# Patient Record
Sex: Male | Born: 1990 | Hispanic: No | Marital: Single | Smoking: Current every day smoker
Health system: Southern US, Community
[De-identification: ages and names within clinical notes are randomized; demographics above are authoritative.]

## PROBLEM LIST (undated history)

## (undated) ENCOUNTER — Emergency Department (HOSPITAL_COMMUNITY): Discharge status: Absent without leave | Payer: PRIVATE HEALTH INSURANCE

## (undated) DIAGNOSIS — Z720 Tobacco use: Secondary | ICD-10-CM

## (undated) HISTORY — DX: Tobacco use: Z72.0

---

## 2014-04-27 ENCOUNTER — Emergency Department (HOSPITAL_COMMUNITY)
Admission: EM | Admit: 2014-04-27 | Discharge: 2014-04-27 | Disposition: A | Discharge status: Home / Self Care | Payer: Managed Care, Other (non HMO) | Attending: Emergency Medicine | Admitting: Emergency Medicine

## 2014-04-27 ENCOUNTER — Encounter (HOSPITAL_COMMUNITY): Payer: Self-pay | Admitting: Emergency Medicine

## 2014-04-27 DIAGNOSIS — F17200 Nicotine dependence, unspecified, uncomplicated: Secondary | ICD-10-CM | POA: Insufficient documentation

## 2014-04-27 DIAGNOSIS — H9209 Otalgia, unspecified ear: Secondary | ICD-10-CM | POA: Insufficient documentation

## 2014-04-27 DIAGNOSIS — J3489 Other specified disorders of nose and nasal sinuses: Secondary | ICD-10-CM | POA: Insufficient documentation

## 2014-04-27 DIAGNOSIS — H9202 Otalgia, left ear: Secondary | ICD-10-CM

## 2014-04-27 DIAGNOSIS — R0981 Nasal congestion: Secondary | ICD-10-CM

## 2014-04-27 MED ORDER — OXYMETAZOLINE HCL 0.05 % NA SOLN: 1.0000 | Freq: Two times a day (BID) | NASAL | Status: AC

## 2014-04-27 NOTE — ED Notes (Addendum)
Pt c/o intermittent L ear pain x 2 days.  Currently, denies pain.      Pt speaks broken [place].

## 2014-04-27 NOTE — ED Provider Notes (Signed)
  Face-to-face evaluation   History: He complains of left, you pain after flying out of the country several days ago. He has had mild sore throat and nasal congestion recently. He denies fever, chills, nausea, vomiting, with this.  Physical exam: Alert , calm, cooperative. TMs appear normal architecture, bilaterally. Left TM has a 3-4 mm, flat lesion that appears to be old scar. There is no erythema of the TM. Oropharynx is slightly injected. There is no tonsillar hypertrophy or exudate. Neck is supple and nontender.  Medical screening examination/treatment/procedure(s) were conducted as a shared visit with non-physician practitioner(s) and myself.  I personally evaluated the patient during the encounter  Richarda Blade, MD 04/27/14 [phone]

## 2014-04-27 NOTE — Discharge Instructions (Signed)
Use afrin nasal spray and spray 1 spray into both nostril twice daily for the next 5 days to help with nasal congestion and ear pain.  If you notice no improvement, please follow up with Dr. Redmond Baseman by calling office to set up an appointment.    Otalgia The most common reason for this in children is an infection of the middle ear. Pain from the middle ear is usually caused by a build-up of fluid and pressure behind the eardrum. Pain from an earache can be sharp, dull, or burning. The pain may be temporary or constant. The middle ear is connected to the nasal passages by a short narrow tube called the Eustachian tube. The Eustachian tube allows fluid to drain out of the middle ear, and helps keep the pressure in your ear equalized. CAUSES  A cold or allergy can block the Eustachian tube with inflammation and the build-up of secretions. This is especially likely in small children, because their Eustachian tube is shorter and more horizontal. When the Eustachian tube closes, the normal flow of fluid from the middle ear is stopped. Fluid can accumulate and cause stuffiness, pain, hearing loss, and an ear infection if germs start growing in this area. SYMPTOMS  The symptoms of an ear infection may include fever, ear pain, fussiness, increased crying, and irritability. Many children will have temporary and minor hearing loss during and right after an ear infection. Permanent hearing loss is rare, but the risk increases the more infections a child has. Other causes of ear pain include retained water in the outer ear canal from swimming and bathing. Ear pain in adults is less likely to be from an ear infection. Ear pain may be referred from other locations. Referred pain may be from the joint between your jaw and the skull. It may also come from a tooth problem or problems in the neck. Other causes of ear pain include:  A foreign body in the ear.  Outer ear infection.  Sinus infections.  Impacted ear  wax.  Ear injury.  Arthritis of the jaw or TMJ problems.  Middle ear infection.  Tooth infections.  Sore throat with pain to the ears. DIAGNOSIS  Your caregiver can usually make the diagnosis by examining you. Sometimes other special studies, including x-rays and lab work may be necessary. TREATMENT   If antibiotics were prescribed, use them as directed and finish them even if you or your child's symptoms seem to be improved.  Sometimes PE tubes are needed in children. These are little plastic tubes which are put into the eardrum during a simple surgical procedure. They allow fluid to drain easier and allow the pressure in the middle ear to equalize. This helps relieve the ear pain caused by pressure changes. HOME CARE INSTRUCTIONS   Only take over-the-counter or prescription medicines for pain, discomfort, or fever as directed by your caregiver. DO NOT GIVE CHILDREN ASPIRIN because of the association of Reye's Syndrome in children taking aspirin.  Use a cold pack applied to the outer ear for 15-20 minutes, 03-04 times per day or as needed may reduce pain. Do not apply ice directly to the skin. You may cause frost bite.  Over-the-counter ear drops used as directed may be effective. Your caregiver may sometimes prescribe ear drops.  Resting in an upright position may help reduce pressure in the middle ear and relieve pain.  Ear pain caused by rapidly descending from high altitudes can be relieved by swallowing or chewing gum. Allowing infants to  suck on a bottle during airplane travel can help.  Do not smoke in the house or near children. If you are unable to quit smoking, smoke outside.  Control allergies. SEEK IMMEDIATE MEDICAL CARE IF:   You or your child are becoming sicker.  Pain or fever relief is not obtained with medicine.  You or your child's symptoms (pain, fever, or irritability) do not improve within 24 to 48 hours or as instructed.  Severe pain suddenly stops  hurting. This may indicate a ruptured eardrum.  You or your children develop new problems such as severe headaches, stiff neck, difficulty swallowing, or swelling of the face or around the ear. Document Released: 05/18/2004 Document Revised: 12/24/2011 Document Reviewed: 09/22/2008 [facility] Patient Information 2015 [place], [place]. This information is not intended to replace advice given to you by your health care provider. Make sure you discuss any questions you have with your health care provider.

## 2014-04-27 NOTE — ED Provider Notes (Signed)
CSN: [id number]     Arrival date & time 04/27/14  1422 History  This chart was scribed for non-physician practitioner Domenic Moras, working with Richarda Blade, MD by Donato Schultz, ED Scribe. This patient was seen in room WTR6/WTR6 and the patient's care was started at 3:41 PM.    Chief Complaint  Patient presents with  . Otalgia    Patient is a 23 y.o. male presenting with ear pain. The history is provided by the patient. No language interpreter was used.  Otalgia Associated symptoms: congestion   Associated symptoms: no fever, no headaches, no hearing loss and no tinnitus    HPI Comments: Samuel Barker is a 23 y.o. male who presents to the Emergency Department complaining of intermittent left otalgia that started two days ago when he woke up.  He describes the onset at sudden and his pain feeling as though something is "cutting the inside of his ear".  He states that his symptoms are the worst in the morning.  He states that he experienced some myalgias and fatigue this morning.  He denies tinnitus, fever, sore throat, sneezing, and decreased hearing as associated symptoms.  He lists nasal congestion as an associated symptom.  The patient states that he had a headache last night but when he woke up this morning it had subsided.  The patient states that he has put olive oil in his ear with no relief to his symptoms.    History reviewed. No pertinent past medical history. History reviewed. No pertinent past surgical history. History reviewed. No pertinent family history. History  Substance Use Topics  . Smoking status: Current Every Day Smoker -- 1.00 packs/day    Types: Cigarettes  . Smokeless tobacco: Never Used  . Alcohol Use: No    Review of Systems  Constitutional: Positive for fatigue. Negative for fever.  HENT: Positive for congestion and ear pain. Negative for hearing loss, sneezing and tinnitus.   Musculoskeletal: Positive for myalgias.  Neurological: Negative for headaches.   All other systems reviewed and are negative.     Allergies  Review of patient's allergies indicates no known allergies.  Home Medications   Prior to Admission medications   Not on File   Triage Vitals: BP 110/63  Pulse 60  Temp(Src) 98.8 F (37.1 C) (Oral)  Resp 16  SpO2 99%  Physical Exam  Nursing note and vitals reviewed. Constitutional: He is oriented to person, place, and time. He appears well-developed and well-nourished.  HENT:  Head: Normocephalic and atraumatic.  Right Ear: Hearing, tympanic membrane, external ear and ear canal normal.  Round lucency at approximately the tip of the malleus posterior to the TM.  TM otherwise clear, not bulging, no signs of perforation or infection.  Ear canal normal. No fb noted.    Eyes: EOM are normal.  Neck: Normal range of motion.  Cardiovascular: Normal rate.   Pulmonary/Chest: Effort normal.  Musculoskeletal: Normal range of motion.  Neurological: He is alert and oriented to person, place, and time.  Skin: Skin is warm and dry.  Psychiatric: He has a normal mood and affect. His behavior is normal.    ED Course  Procedures (including critical care time)  DIAGNOSTIC STUDIES: Oxygen Saturation is 99% on room air, normal by my interpretation.    COORDINATION OF CARE: 3:45 PM- Discussed speaking with the attending physician to determine an appropriate treatment plan.  Advised the patient to follow-up with an ENT specialist.  The patient agreed to the treatment plan.  4:14 PM Dr. Eulis Foster has seen pt and felt abnormal changes likely an old scar.  Has nasal congestion so will provide afrin as sxs treatment, ENT referral as needed.  Doubt bacterial infection.    Labs Review Labs Reviewed - No data to display  Imaging Review No results found.   EKG Interpretation None      MDM   Final diagnoses:  Left ear pain  Nasal congestion    BP 110/63  Pulse 60  Temp(Src) 98.8 F (37.1 C) (Oral)  Resp 16  SpO2  99%  BP 110/63  Pulse 60  Temp(Src) 98.8 F (37.1 C) (Oral)  Resp 16  SpO2 99%  I have reviewed nursing notes and vital signs.  I personally performed the services described in this documentation, which was scribed in my presence. The recorded information has been reviewed and is accurate.     Domenic Moras, PA-C 04/27/14 1615

## 2014-07-22 ENCOUNTER — Ambulatory Visit (INDEPENDENT_AMBULATORY_CARE_PROVIDER_SITE_OTHER): Payer: Managed Care, Other (non HMO) | Admitting: Family Medicine

## 2014-07-22 VITALS — BP 110/68 | HR 76 | Temp 98.0°F | Resp 16 | Ht 66.0 in | Wt 157.0 lb

## 2014-07-22 DIAGNOSIS — R51 Headache: Secondary | ICD-10-CM

## 2014-07-22 DIAGNOSIS — R519 Headache, unspecified: Secondary | ICD-10-CM

## 2014-07-22 NOTE — Progress Notes (Signed)
[facility] [address] [place] [address] [phone]  Date:  07/22/2014   Name:  Samuel Barker   DOB:  June 14, 1991   MRN:  [id number]  PCP:  No PCP Per Patient    Chief Complaint: Headache   History of Present Illness:  Samuel Barker is a 23 y.o. very pleasant male patient who presents with the following:  He noted a HA when he awoke this am.  He noted some pain behind his right eye.  This lasted about 5 minutes and then got better, but did come back once or twice during the morning  Currently he feels fine, HA is resolved He did not try taking any medication at home.  No sore throat, he does have a mild cough which he attributes to smoking.  Vision is ok, normal. No nausea or vomiting.  He is otherwise generally healthy.   He does get HA at times, but the ha he had this morning was "quite strong."    There are no active problems to display for this patient.   History reviewed. No pertinent past medical history.  History reviewed. No pertinent past surgical history.  History  Substance Use Topics  . Smoking status: Current Every Day Smoker -- 1.00 packs/day    Types: Cigarettes  . Smokeless tobacco: Never Used  . Alcohol Use: No    Family History  Problem Relation Age of Onset  . Diabetes Mother   . Diabetes Father   . Diabetes Maternal Grandmother   . Diabetes Maternal Grandfather   . Diabetes Paternal Grandmother   . Heart disease Paternal Grandmother   . Diabetes Paternal Grandfather     No Known Allergies  Medication list has been reviewed and updated.  No current outpatient prescriptions on file prior to visit.   No current facility-administered medications on file prior to visit.    Review of Systems:  As per HPI- otherwise negative.   Physical Examination: Filed Vitals:   07/22/14 1459  BP: 110/68  Pulse: 76  Temp: 98 F (36.7 C)  Resp: 16   Filed Vitals:   07/22/14 1459  Height: 5\' 6"  (1.676 m)  Weight: 157  lb (71.215 kg)   Body mass index is 25.35 kg/(m^2). Ideal Body Weight: Weight in (lb) to have BMI = 25: 154.6  GEN: WDWN, NAD, Non-toxic, A & O x 3, looks very well and comfortable HEENT: Atraumatic, Normocephalic. Neck supple. No masses, No LAD.  Bilateral TM wnl, oropharynx normal.  PEERL,EOMI.   Normal fundoscopic exam Ears and Nose: No external deformity. CV: RRR, No M/G/R. No JVD. No thrill. No extra heart sounds. PULM: CTA B, no wheezes, crackles, rhonchi. No retractions. No resp. distress. No accessory muscle use. ABD: S, NT, ND EXTR: No c/c/e NEURO Normal gait. Full neurological exam including strength, sensation and DTR all extremities.  Normal facial sensation and movement.  Normal romberg PSYCH: Normally interactive. Conversant. Not depressed or anxious appearing.  Calm demeanor.    Assessment and Plan: Acute nonintractable headache, unspecified headache type  Discussed with pt in detail.  Offered to order a CT scan to evaluate for any more serious cause of his headache.  Discussed risks of CT (radiation) and benefits.  He would like to defer CT for now, but will seek care right away if any persistent severe HA returns.  If he still has trouble tomorrow we can order a CT non- contrast for him.   See patient instructions for more  details.     Signed Lamar Blinks, MD

## 2014-07-22 NOTE — Patient Instructions (Signed)
Since your headache is currently ok, we will wait on doing a CT scan.  However if your headache comes back or is still bothering you tomorrow we can order the test for you.  If you start to have a severe or unusual headache tonight go to the ER for further evaluation

## 2014-08-18 ENCOUNTER — Ambulatory Visit (INDEPENDENT_AMBULATORY_CARE_PROVIDER_SITE_OTHER): Payer: Managed Care, Other (non HMO) | Admitting: Physician Assistant

## 2014-08-18 VITALS — BP 110/66 | HR 85 | Temp 98.5°F | Resp 18 | Ht 66.0 in | Wt 155.2 lb

## 2014-08-18 DIAGNOSIS — R0981 Nasal congestion: Secondary | ICD-10-CM

## 2014-08-18 DIAGNOSIS — R059 Cough, unspecified: Secondary | ICD-10-CM

## 2014-08-18 DIAGNOSIS — R05 Cough: Secondary | ICD-10-CM

## 2014-08-18 MED ORDER — HYDROCOD POLST-CHLORPHEN POLST 10-8 MG/5ML PO LQCR: 5.0000 mL | Freq: Two times a day (BID) | ORAL | Status: AC | PRN

## 2014-08-18 MED ORDER — GUAIFENESIN ER 1200 MG PO TB12: 1.0000 | ORAL_TABLET | Freq: Two times a day (BID) | ORAL | Status: DC | PRN

## 2014-08-18 MED ORDER — BENZONATATE 100 MG PO CAPS: 100.0000 mg | ORAL_CAPSULE | Freq: Three times a day (TID) | ORAL | Status: AC | PRN

## 2014-08-18 MED ORDER — IPRATROPIUM BROMIDE 0.03 % NA SOLN: 2.0000 | Freq: Two times a day (BID) | NASAL | Status: DC

## 2014-08-18 NOTE — Patient Instructions (Signed)
+  Drink plenty of fluids (water) and rest  Tessalon pearls are 1-2 pearls three times a day  Take the tussionex to reduce the cough at night  +Mucinex will break down the mucus so that you breathe better  +Atrovent will get rid of the mucus.    If the symptoms last in 8 days, please return to the clinic.

## 2014-08-18 NOTE — Progress Notes (Signed)
   Subjective:    Patient ID: Samuel Barker, male    DOB: 08/12/1991, 23 y.o.   MRN: [id number]   HPI    23 year old males are here today for 2 days nasal congestion.  It began with nasal congestion, and sore throat.  He then began coughing, which is moreso during the day, but keeps him up some at night.  Today the sore throat is very mild.  He denies any sinus pressure, headache, ear pain, photophobia, fever, or chills.  He states that he had some vomiting sensation post round of extreme coughing and chest tightness.  The coughing produces some yellow sputum.  He denies vomiting or diarrhea.  He denies any rash.  He does not know of any sick contacts.     Review of Systems ROS OTHERWISE UNREMARKABLE UNLESS LISTED ABOVE.    Objective:   Physical Exam  Constitutional: He appears well-developed and well-nourished. No distress.  BP 110/66 mmHg  Pulse 85  Temp(Src) 98.5 F (36.9 C) (Oral)  Resp 18  Ht 5\' 6"  (1.676 m)  Wt 155 lb 3.2 oz (70.398 kg)  BMI 25.06 kg/m2  SpO2 97%   HENT:  Head: Normocephalic and atraumatic.  Right Ear: No drainage or tenderness. Tympanic membrane is not injected, not erythematous and not bulging. No middle ear effusion. No decreased hearing is noted.  Left Ear: No drainage or tenderness. Tympanic membrane is not injected, not erythematous and not bulging.  No middle ear effusion. No decreased hearing is noted.  Nose: Mucosal edema and rhinorrhea present. Right sinus exhibits no maxillary sinus tenderness and no frontal sinus tenderness. Left sinus exhibits no maxillary sinus tenderness and no frontal sinus tenderness.  Mouth/Throat: No trismus in the jaw. No uvula swelling. No oropharyngeal exudate, posterior oropharyngeal edema or posterior oropharyngeal erythema.  Eyes: Conjunctivae are normal. Pupils are equal, round, and reactive to light. Right eye exhibits no discharge. Left eye exhibits no discharge.  Cardiovascular: Normal rate, regular rhythm and normal  heart sounds.  Exam reveals no gallop and no friction rub.   No murmur heard. Pulmonary/Chest: Effort normal and breath sounds normal. No apnea and no tachypnea. No respiratory distress. He has no decreased breath sounds. He has no wheezes. He exhibits no tenderness.  Lymphadenopathy:       Head (right side): No submandibular, no tonsillar, no preauricular, no posterior auricular and no occipital adenopathy present.       Head (left side): No submandibular, no tonsillar, no preauricular, no posterior auricular and no occipital adenopathy present.    He has no cervical adenopathy.    He has no axillary adenopathy.          Assessment & Plan:  23 year old male is here today for complaints of UR symptoms.  I am more suspicious that this is a viral URI from physical exam and history.  I will treat him supportively.  He will return in 8 days if symptoms do not resolve.    Cough chlorpheniramine-HYDROcodone (TUSSIONEX PENNKINETIC ER) 10-8 MG/5ML LQCR, benzonatate (TESSALON) 100 MG capsule  Nasal congestion ipratropium (ATROVENT) 0.03 % nasal spray, Guaifenesin (MUCINEX MAXIMUM STRENGTH) 1200 MG TB12   Ivar Drape, PA-C [facility] 11/4/20157:00 PM

## 2014-08-18 NOTE — Progress Notes (Signed)
I have discussed this case with Ms. English, PA-C and agree.

## 2014-09-14 ENCOUNTER — Emergency Department (HOSPITAL_COMMUNITY)
Admission: EM | Admit: 2014-09-14 | Discharge: 2014-09-14 | Discharge status: Against Medical Advice | Payer: Managed Care, Other (non HMO) | Attending: Emergency Medicine | Admitting: Emergency Medicine

## 2014-09-14 ENCOUNTER — Encounter (HOSPITAL_COMMUNITY): Payer: Self-pay | Admitting: *Deleted

## 2014-09-14 DIAGNOSIS — Z720 Tobacco use: Secondary | ICD-10-CM | POA: Diagnosis not present

## 2014-09-14 DIAGNOSIS — R1904 Left lower quadrant abdominal swelling, mass and lump: Secondary | ICD-10-CM | POA: Insufficient documentation

## 2014-09-14 DIAGNOSIS — R1032 Left lower quadrant pain: Secondary | ICD-10-CM | POA: Insufficient documentation

## 2014-09-14 NOTE — ED Notes (Signed)
Pt called for a second time and no response

## 2014-09-14 NOTE — ED Notes (Signed)
Pt called to be roomed but no response from the lobby.

## 2014-09-14 NOTE — ED Notes (Signed)
Pt reports L groin pain, noticing a possible hernia x 1 month ago.  Pt reports area is getting bigger and more painful.  Pt with limited english

## 2014-09-15 ENCOUNTER — Ambulatory Visit (INDEPENDENT_AMBULATORY_CARE_PROVIDER_SITE_OTHER): Payer: Managed Care, Other (non HMO) | Admitting: Family Medicine

## 2014-09-15 VITALS — BP 122/72 | HR 70 | Temp 98.2°F | Resp 16 | Ht 65.0 in | Wt 158.4 lb

## 2014-09-15 DIAGNOSIS — Z23 Encounter for immunization: Secondary | ICD-10-CM

## 2014-09-15 DIAGNOSIS — R059 Cough, unspecified: Secondary | ICD-10-CM

## 2014-09-15 DIAGNOSIS — L02214 Cutaneous abscess of groin: Secondary | ICD-10-CM

## 2014-09-15 DIAGNOSIS — R05 Cough: Secondary | ICD-10-CM

## 2014-09-15 MED ORDER — HYDROCODONE-HOMATROPINE 5-1.5 MG/5ML PO SYRP: 5.0000 mL | ORAL_SOLUTION | ORAL | Status: DC | PRN

## 2014-09-15 MED ORDER — BENZONATATE 100 MG PO CAPS: 100.0000 mg | ORAL_CAPSULE | Freq: Three times a day (TID) | ORAL | Status: DC | PRN

## 2014-09-15 MED ORDER — DOXYCYCLINE HYCLATE 100 MG PO CAPS: 100.0000 mg | ORAL_CAPSULE | Freq: Two times a day (BID) | ORAL | Status: DC

## 2014-09-15 NOTE — Patient Instructions (Addendum)
Take doxycycline antibiotic one twice daily for infection of groin and of lung  Take the cough syrup 1 teaspoon every 4 hours as needed at nighttime for cough. This will cause you to feel sedated (sleepy)   Take the cough pills one or 2 pills 3 times daily as needed for daytime cough. This will not cause much sedation  Return if worse at any time  Return as directed to recheck the abscess    Influenza Vaccine (Flu Vaccine, Inactivated or Recombinant) 2014-2015: What You Need to Know 1. Why get vaccinated? Influenza ("flu") is a contagious disease that spreads around the [place] every winter, usually between October and May. Flu is caused by influenza viruses, and is spread mainly by coughing, sneezing, and close contact. Anyone can get flu, but the risk of getting flu is highest among children. Symptoms come on suddenly and may last several days. They can include:  fever/chills  sore throat  muscle aches  fatigue  cough  headache  runny or stuffy nose Flu can make some people much sicker than others. These people include young children, people 4 and older, pregnant women, and people with certain health conditions-such as heart, lung or kidney disease, nervous system disorders, or a weakened immune system. Flu vaccination is especially important for these people, and anyone in close contact with them. Flu can also lead to pneumonia, and make existing medical conditions worse. It can cause diarrhea and seizures in children. Each year thousands of people in the [place] die from flu, and many more are hospitalized. Flu vaccine is the best protection against flu and its complications. Flu vaccine also helps prevent spreading flu from person to person. 2. Inactivated and recombinant flu vaccines You are getting an injectable flu vaccine, which is either an "inactivated" or "recombinant" vaccine. These vaccines do not contain any live influenza virus. They are given by  injection with a needle, and often called the "flu shot."  A different live, attenuated (weakened) influenza vaccine is sprayed into the nostrils. This vaccine is described in a separate Vaccine Information Statement. Flu vaccination is recommended every year. Some children 6 months through 47 years of age might need two doses during one year. Flu viruses are always changing. Each year's flu vaccine is made to protect against 3 or 4 viruses that are likely to cause disease that year. Flu vaccine cannot prevent all cases of flu, but it is the best defense against the disease.  It takes about 2 weeks for protection to develop after the vaccination, and protection lasts several months to a year. Some illnesses that are not caused by influenza virus are often mistaken for flu. Flu vaccine will not prevent these illnesses. It can only prevent influenza. Some inactivated flu vaccine contains a very small amount of a mercury-based preservative called thimerosal. Studies have shown that thimerosal in vaccines is not harmful, but flu vaccines that do not contain a preservative are available. 3. Some people should not get this vaccine Tell the person who gives you the vaccine:  If you have any severe, life-threatening allergies. If you ever had a life-threatening allergic reaction after a dose of flu vaccine, or have a severe allergy to any part of this vaccine, including (for example) an allergy to gelatin, antibiotics, or eggs, you may be advised not to get vaccinated. Most, but not all, types of flu vaccine contain a small amount of egg protein.  If you ever had Guillain-Barr Syndrome (a severe paralyzing illness, also  called GBS). Some people with a history of GBS should not get this vaccine. This should be discussed with your doctor.  If you are not feeling well. It is usually okay to get flu vaccine when you have a mild illness, but you might be advised to wait until you feel better. You should come back  when you are better. 4. Risks of a vaccine reaction With a vaccine, like any medicine, there is a chance of side effects. These are usually mild and go away on their own. Problems that could happen after any vaccine:  Brief fainting spells can happen after any medical procedure, including vaccination. Sitting or lying down for about 15 minutes can help prevent fainting, and injuries caused by a fall. Tell your doctor if you feel dizzy, or have vision changes or ringing in the ears.  Severe shoulder pain and reduced range of motion in the arm where a shot was given can happen, very rarely, after a vaccination.  Severe allergic reactions from a vaccine are very rare, estimated at less than 1 in a million doses. If one were to occur, it would usually be within a few minutes to a few hours after the vaccination. Mild problems following inactivated flu vaccine:  soreness, redness, or swelling where the shot was given  hoarseness  sore, red or itchy eyes  cough  fever  aches  headache  itching  fatigue If these problems occur, they usually begin soon after the shot and last 1 or 2 days. Moderate problems following inactivated flu vaccine:  Young children who get inactivated flu vaccine and pneumococcal vaccine (PCV13) at the same time may be at increased risk for seizures caused by fever. Ask your doctor for more information. Tell your doctor if a child who is getting flu vaccine has ever had a seizure. Inactivated flu vaccine does not contain live flu virus, so you cannot get the flu from this vaccine. As with any medicine, there is a very remote chance of a vaccine causing a serious injury or death. The safety of vaccines is always being monitored. For more information, visit: http://www.aguilar.org/ 5. What if there is a serious reaction? What should I look for?  Look for anything that concerns you, such as signs of a severe allergic reaction, very high fever, or behavior  changes. Signs of a severe allergic reaction can include hives, swelling of the face and throat, difficulty breathing, a fast heartbeat, dizziness, and weakness. These would start a few minutes to a few hours after the vaccination. What should I do?  If you think it is a severe allergic reaction or other emergency that can't wait, call 9-1-1 and get the person to the nearest hospital. Otherwise, call your doctor.  Afterward, the reaction should be reported to the Vaccine Adverse Event Reporting System (VAERS). Your doctor should file this report, or you can do it yourself through the VAERS web site at www.vaers.SamedayNews.es, or by calling 908-191-7704. VAERS does not give medical advice. 6. The National Vaccine Injury Compensation Program The [employer] Vaccine Injury Compensation Program (VICP) is a federal program that was created to compensate people who may have been injured by certain vaccines. Persons who believe they may have been injured by a vaccine can learn about the program and about filing a claim by calling 787-699-8902 or visiting the [employer] website at GoldCloset.com.ee. There is a time limit to file a claim for compensation. 7. How can I learn more?  Ask your health care provider.  Call your local or state health department.  Contact the Centers for Disease Control and Prevention (CDC):  Call 517-517-7893 (1-800-CDC-INFO) or  Visit CDC's website at https://gibson.com/ CDC Vaccine Information Statement (Interim) Inactivated Influenza Vaccine (06/02/2013) Document Released: 07/26/2006 Document Revised: 02/15/2014 Document Reviewed: 09/18/2013 [facility] Patient Information 2015 [place]. This information is not intended to replace advice given to you by your health care provider. Make sure you discuss any questions you have with your health care provider.  Incision and Drainage Incision and drainage is a procedure in which a sac-like structure (cystic structure)  is opened and drained. The area to be drained usually contains material such as pus, fluid, or blood.  LET YOUR CAREGIVER KNOW ABOUT:   Allergies to medicine.  Medicines taken, including vitamins, herbs, eyedrops, over-the-counter medicines, and creams.  Use of steroids (by mouth or creams).  Previous problems with anesthetics or numbing medicines.  History of bleeding problems or blood clots.  Previous surgery.  Other health problems, including diabetes and kidney problems.  Possibility of pregnancy, if this applies. RISKS AND COMPLICATIONS  Pain.  Bleeding.  Scarring.  Infection. BEFORE THE PROCEDURE  You may need to have an ultrasound or other imaging tests to see how large or deep your cystic structure is. Blood tests may also be used to determine if you have an infection or how severe the infection is. You may need to have a tetanus shot. PROCEDURE  The affected area is cleaned with a cleaning fluid. The cyst area will then be numbed with a medicine (local anesthetic). A small incision will be made in the cystic structure. A syringe or catheter may be used to drain the contents of the cystic structure, or the contents may be squeezed out. The area will then be flushed with a cleansing solution. After cleansing the area, it is often gently packed with a gauze or another wound dressing. Once it is packed, it will be covered with gauze and tape or some other type of wound dressing. AFTER THE PROCEDURE   Often, you will be allowed to go home right after the procedure.  You may be given antibiotic medicine to prevent or heal an infection.  If the area was packed with gauze or some other wound dressing, you will likely need to come back in 1 to 2 days to get it removed.  The area should heal in about 14 days. Document Released: 03/27/2001 Document Revised: 04/01/2012 Document Reviewed: 11/26/2011 [facility] Patient Information 2015 [place], [place]. This information is not  intended to replace advice given to you by your health care provider. Make sure you discuss any questions you have with your health care provider.

## 2014-09-15 NOTE — Progress Notes (Signed)
Subjective: 23 year old male who's had a cough for the last month. He was treated with some cough syrup but is continued to have symptoms. He also has a painful area in his left groin. This developed over the last several weeks, starting initially as a pea-sized nodule and now growing considerably. He needs a flu shot. He apparently has had a little mucus production in the mornings. He does smoke. He has had a little wheezing occasionally. The cough is worse when he lays down.   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 He finishes those courses, and will return to [place] for a little while then returned to the [place]  Objective: No acute distress. TMs normal. Throat clear. Nose not congested. Neck supple without significant nodes. Chest is clear to auscultation. Heart regular without murmurs. Left groin area on the upper medial thigh just adjacent to the scrotum is a 3-1/2 cm cystic area. It is fairly superficial. Not red. Not quite to a head yet. Very tender.  Assessment: Cystic abscess left groin Persistent cough Tobacco use disorder Flu vaccine  Plan: Flu shot I&D Treat with doxycycline which would help possible respiratory infection along with treating the abscess

## 2014-09-15 NOTE — Progress Notes (Signed)
Procedure Note: Consent obtained.  2% Alcohol pad applied to abscess.  Lidocaine with Epi.  Cleaned with povidine.  15 blade used for 1 inch incision to abscess at left medial thigh proximal to the groin.  Drainage fluid.  Packing inserted into drained abscess.  Povidine wiped with saline.  Dressings applied.

## 2014-09-17 ENCOUNTER — Ambulatory Visit (INDEPENDENT_AMBULATORY_CARE_PROVIDER_SITE_OTHER): Payer: Managed Care, Other (non HMO) | Admitting: Physician Assistant

## 2014-09-17 VITALS — BP 112/62 | HR 72 | Temp 98.2°F | Resp 18

## 2014-09-17 DIAGNOSIS — L02214 Cutaneous abscess of groin: Secondary | ICD-10-CM

## 2014-09-17 NOTE — Progress Notes (Signed)
   Subjective:    Patient ID: Samuel Barker, male    DOB: 09-Nov-1990, 23 y.o.   MRN: [id number]  HPI  23 yom returns to clinic for wound care.   He was seen 2 days ago with a groin abscess. I&D done at that time, wound was bandaged, and he was started on doxy.   Today he states he has been doing well. Denies any fever or chills. He has not changed the dressing. The pain in the area is still present but has improved slightly. Has been taking his doxy bid as prescribed.   Review of Systems No CP, SOB.    Objective:   Physical Exam  Constitutional:  BP 112/62 mmHg  Pulse 72  Temp(Src) 98.2 F (36.8 C) (Oral)  Resp 18  SpO2 98%   Skin:  Dressing removed. Small amount serosanguinous drainage on dressing. Packing removed. Wound bed healing well with granulation tissue throughout. No eschar or maceration. 1 cm incision. Max 1 cm depth at distal portion of wound. No purulence. 2cm x 1cm surrounding induration. No fluctuance. No surrounding erythema. Area very TTP. Wound flushed with 5cc 2% lido without epi in order to palpate depth.       Assessment & Plan:   23 yom returns to clinic for wound care.   Groin abscess --Wound healing well --No purulence --Dressed, no packing placed --Continue doxy --rtc instructions if purulence, fever, chills, or pain worsens  Julieta Gutting, PA-C Physician Assistant[facility]  09/17/2014 12:10 PM

## 2014-09-17 NOTE — Progress Notes (Signed)
The patient was discussed with me and I agree with the diagnosis and treatment plan.  

## 2014-09-17 NOTE — Patient Instructions (Addendum)
Your wound is healing well, we did not repack the wound.  Please take the bandage off tomorrow, it is ok to shower, just don't spray the water directly on the site for a few days. Continue to take your antibiotic twice daily until you are out of pills.  If you notice fever or chills, if you notice white discharge coming from the site, or if you notice the pain getting a lot worse, please come back to clinic.

## 2014-09-18 LAB — WOUND CULTURE
GRAM STAIN: NONE SEEN
Gram Stain: NONE SEEN

## 2014-09-27 ENCOUNTER — Encounter: Payer: Self-pay | Admitting: *Deleted

## 2015-06-10 ENCOUNTER — Ambulatory Visit (INDEPENDENT_AMBULATORY_CARE_PROVIDER_SITE_OTHER): Payer: PPO | Admitting: Physician Assistant

## 2015-06-10 VITALS — BP 116/80 | HR 60 | Temp 98.4°F | Resp 16 | Ht 65.5 in | Wt 171.8 lb

## 2015-06-10 DIAGNOSIS — Z23 Encounter for immunization: Secondary | ICD-10-CM

## 2015-06-10 DIAGNOSIS — Z111 Encounter for screening for respiratory tuberculosis: Secondary | ICD-10-CM | POA: Diagnosis not present

## 2015-06-10 NOTE — Progress Notes (Signed)
   Subjective:    Patient ID: Samuel Barker, male    DOB: 1990-11-01, 24 y.o.   MRN: [id number]  Chief Complaint  Patient presents with  . Immunizations    for school   Medications, allergies, past medical history, surgical history, family history, social history and problem list reviewed and updated.  HPI  4 yom presents needing immunizations for school form.   He is due for mmr, tdap booster, and ppd placement today per form. 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 No immunization records found in [employer]. Pt denies cp, sob, fevers, chills, hemoptysis, night sweats, unintentional wt loss.   Review of Systems See HPI     Objective:   Physical Exam  Constitutional: He is oriented to person, place, and time. He appears well-developed and well-nourished.  Non-toxic appearance. He does not have a sickly appearance. He does not appear ill. No distress.  BP 116/80 mmHg  Pulse 60  Temp(Src) 98.4 F (36.9 C) (Oral)  Resp 16  Ht 5' 5.5" (1.664 m)  Wt 171 lb 12.8 oz (77.928 kg)  BMI 28.14 kg/m2  SpO2 98%   Neurological: He is alert and oriented to person, place, and time.  Psychiatric: He has a normal mood and affect. His speech is normal and behavior is normal.      Assessment & Plan:   Need for diphtheria-tetanus-pertussis (Tdap) vaccine, adult/adolescent - Plan: Tdap vaccine greater than or equal to 7yo IM  Need for MMR vaccine - Plan: MMR vaccine subcutaneous  Encounter for PPD test - Plan: TB Skin Test --vaccinations given --rtc 30 days for 2nd mmr --rtc 48-72 hrs for ppd check  Julieta Gutting, PA-C Physician Assistant-Certified [facility]  06/10/2015 9:18 PM

## 2015-06-10 NOTE — Patient Instructions (Signed)
You received the tdap booster vaccine.  You received the 1st of 2 MMR vaccines today. Please return in 30 days for the 2nd and final MMR immunization. We placed the ppd test today to check for TB. Please return in 48-72 hours for [place] to check this test.

## 2015-06-13 ENCOUNTER — Ambulatory Visit (INDEPENDENT_AMBULATORY_CARE_PROVIDER_SITE_OTHER): Payer: 59 | Admitting: *Deleted

## 2015-06-13 DIAGNOSIS — Z111 Encounter for screening for respiratory tuberculosis: Secondary | ICD-10-CM

## 2015-06-13 DIAGNOSIS — Z7689 Persons encountering health services in other specified circumstances: Secondary | ICD-10-CM

## 2015-06-13 LAB — TB SKIN TEST
Induration: 10 mm
TB Skin Test: NEGATIVE

## 2015-08-22 ENCOUNTER — Ambulatory Visit (INDEPENDENT_AMBULATORY_CARE_PROVIDER_SITE_OTHER): Payer: PPO | Admitting: Urgent Care

## 2015-08-22 VITALS — BP 122/76 | HR 79 | Temp 98.7°F | Resp 16 | Ht 65.5 in | Wt 173.6 lb

## 2015-08-22 DIAGNOSIS — Z23 Encounter for immunization: Secondary | ICD-10-CM | POA: Diagnosis not present

## 2015-08-22 NOTE — Patient Instructions (Signed)
MMR Vaccine (Measles, Mumps and Rubella): What You Need to Know 1. Why get vaccinated? Measles, mumps, and rubella are serious diseases. Before vaccines they were very common, especially among children. Measles  Measles virus causes rash, cough, runny nose, eye irritation, and fever.  It can lead to ear infection, pneumonia, seizures (jerking and staring), brain damage, and death. Mumps  Mumps virus causes fever, headache, muscle pain, loss of appetite, and swollen glands.  It can lead to deafness, meningitis (infection of the brain and spinal cord covering), painful swelling of the testicles or ovaries, and rarely sterility. Rubella (German Measles)  Rubella virus causes rash, arthritis (mostly in women), and mild fever.  If a woman gets rubella while she is pregnant, she could have a miscarriage or her baby could be born with serious birth defects. These diseases spread from person to person through the air. You can easily catch them by being around someone who is already infected. Measles, mumps, and rubella (MMR) vaccine can protect children (and adults) from all three of these diseases. Thanks to successful vaccination programs these diseases are much less common in the [place] than they used to be. But if we stopped vaccinating they would return.  2. Who should get MMR vaccine and when? Children should get 2 doses of MMR vaccine:  First Dose: 12-15 months of age  Second Dose: 4-6 years of age (may be given earlier, if at least 28 days after the 1st dose) Some infants younger than 12 months should get a dose of MMR if they are traveling out of the country. (This dose will not count toward their routine series.) Some adults should also get MMR vaccine: Generally, anyone 18 years of age or older who was born after 1955 should get at least one dose of MMR vaccine, unless they can show that they have either been vaccinated or had all three diseases. MMR vaccine may be given at the same  time as other vaccines. Children between 1 and 12 years of age can get a "combination" vaccine called MMRV, which contains both MMR and varicella (chickenpox) vaccines. There is a separate Vaccine Information Statement for MMRV. 3. Some people should not get MMR vaccine or should wait.  Anyone who has ever had a life-threatening allergic reaction to the antibiotic neomycin, or any other component of MMR vaccine, should not get the vaccine. Tell your doctor if you have any severe allergies.  Anyone who had a life-threatening allergic reaction to a previous dose of MMR or MMRV vaccine should not get another dose.  Some people who are sick at the time the shot is scheduled may be advised to wait until they recover before getting MMR vaccine.  Pregnant women should not get MMR vaccine. Pregnant women who need the vaccine should wait until after giving birth. Women should avoid getting pregnant for 4 weeks after vaccination with MMR vaccine.  Tell your doctor if the person getting the vaccine:  Has HIV/AIDS, or another disease that affects the immune system  Is being treated with drugs that affect the immune system, such as steroids  Has any kind of cancer  Is being treated for cancer with radiation or drugs  Has ever had a low platelet count (a blood disorder)  Has gotten another vaccine within the past 4 weeks  Has recently had a transfusion or received other blood products Any of these might be a reason to not get the vaccine, or delay vaccination until later. 4. What are the risks   another vaccine within the past 4 weeks    Has recently had a transfusion or received other blood products  Any of these might be a reason to not get the vaccine, or delay vaccination until later.  4. What are the risks from MMR vaccine?  A vaccine, like any medicine, is capable of causing serious problems, such as severe allergic reactions.   The risk of MMR vaccine causing serious harm, or death, is extremely small.  Getting MMR vaccine is much safer than getting measles, mumps or rubella.  Most people who get MMR vaccine do not have any serious problems with it.  Mild problems  · Fever (up to 1 person out of 6)  · Mild rash  (about 1 person out of 20)  · Swelling of glands in the cheeks or neck (about 1 person out of 75)  If these problems occur, it is usually within 6-14 days after the shot. They occur less often after the second dose.  Moderate problems  · Seizure (jerking or staring) caused by fever (about 1 out of 3,000 doses)  · Temporary pain and stiffness in the joints, mostly in teenage or adult women (up to 1 out of 4)  · Temporary low platelet count, which can cause a bleeding disorder (about 1 out of 30,000 doses)  Severe problems (very rare)  · Serious allergic reaction (less than 1 out of a million doses)  · Several other severe problems have been reported after a child gets MMR vaccine, including:    Deafness    Long-term seizures, coma, or lowered consciousness    Permanent brain damage  These are so rare that it is hard to tell whether they are caused by the vaccine.   5. What if there is a serious reaction?  What should I look for?  · Look for anything that concerns you, such as signs of a severe allergic reaction, very high fever, or behavior changes.  Signs of a severe allergic reaction can include hives, swelling of the face and throat, difficulty breathing, a fast heartbeat, dizziness, and weakness. These would start a few minutes to a few hours after the vaccination.   What should I do?  · If you think it is a severe allergic reaction or other emergency that can't wait, call 9-1-1 or get the person to the nearest hospital. Otherwise, call your doctor.  · Afterward, the reaction should be reported to the Vaccine Adverse Event Reporting System (VAERS). Your doctor might file this report, or you can do it yourself through the VAERS web site at www.vaers.hhs.gov, or by calling 1-800-822-7967.  VAERS is only for reporting reactions. They do not give medical advice.  6. The National Vaccine Injury Compensation Program  The National Vaccine Injury Compensation Program (VICP) is a federal program that was created to  compensate people who may have been injured by certain vaccines.  Persons who believe they may have been injured by a vaccine can learn about the program and about filing a claim by calling 1-800-338-2382 or visiting the VICP website at www.hrsa.gov/vaccinecompensation.  7. How can I learn more?  · Ask your doctor.  · Call your local or state health department.  · Contact the Centers for Disease Control and Prevention (CDC):    Call 1-800-232-4636 (1-800-CDC-INFO)  or    Visit CDC's website at www.cdc.gov/vaccines  CDC Measles, Mumps, and Rubella (MMR) Interim VIS (02/02/11)     This information is not intended to replace advice given to

## 2015-08-22 NOTE — Progress Notes (Signed)
    MRN: [id number] DOB: 1990/11/01  Subjective:   Samuel Barker is a 24 y.o. male presenting for chief complaint of Immunizations  Patient is here for his second dose MMR as required by his school. Patient was here in August 2016 and received his first dose of MMR then. He denies reaction to the injection. Denies any other aggravating or relieving factors, no other questions or concerns.  Samuel Barker currently has no medications in their medication list. Also has No Known Allergies.  Samuel Barker  has no past medical history on file. Also  has no past surgical history on file.  Objective:   Vitals: BP 122/76 mmHg  Pulse 79  Temp(Src) 98.7 F (37.1 C) (Oral)  Resp 16  Ht 5' 5.5" (1.664 m)  Wt 173 lb 9.6 oz (78.744 kg)  BMI 28.44 kg/m2  SpO2 98%  Physical Exam  Constitutional: He is oriented to person, place, and time. He appears well-developed and well-nourished.  Cardiovascular: Normal rate.   Pulmonary/Chest: Effort normal.  Neurological: He is alert and oriented to person, place, and time.  Psychiatric: He has a normal mood and affect.   Assessment and Plan :   1. Need for MMR vaccine - Administered 2nd dose of MMR, a copy of his immunization record was provided. Return to clinic as needed.  Jaynee Eagles, PA-C [facility] [phone] 08/22/2015 9:16 AM

## 2015-12-29 ENCOUNTER — Ambulatory Visit (INDEPENDENT_AMBULATORY_CARE_PROVIDER_SITE_OTHER): Payer: PPO | Admitting: Family Medicine

## 2015-12-29 DIAGNOSIS — S39012A Strain of muscle, fascia and tendon of lower back, initial encounter: Secondary | ICD-10-CM | POA: Diagnosis not present

## 2015-12-29 DIAGNOSIS — S60511A Abrasion of right hand, initial encounter: Secondary | ICD-10-CM

## 2015-12-29 DIAGNOSIS — S39011A Strain of muscle, fascia and tendon of abdomen, initial encounter: Secondary | ICD-10-CM | POA: Diagnosis not present

## 2015-12-29 DIAGNOSIS — S46911A Strain of unspecified muscle, fascia and tendon at shoulder and upper arm level, right arm, initial encounter: Secondary | ICD-10-CM | POA: Diagnosis not present

## 2015-12-29 DIAGNOSIS — S161XXA Strain of muscle, fascia and tendon at neck level, initial encounter: Secondary | ICD-10-CM | POA: Diagnosis not present

## 2015-12-29 DIAGNOSIS — S76219A Strain of adductor muscle, fascia and tendon of unspecified thigh, initial encounter: Secondary | ICD-10-CM

## 2015-12-29 DIAGNOSIS — M25511 Pain in right shoulder: Secondary | ICD-10-CM

## 2015-12-29 DIAGNOSIS — R42 Dizziness and giddiness: Secondary | ICD-10-CM

## 2015-12-29 DIAGNOSIS — S20219A Contusion of unspecified front wall of thorax, initial encounter: Secondary | ICD-10-CM

## 2015-12-29 MED ORDER — CYCLOBENZAPRINE HCL 10 MG PO TABS: 10.0000 mg | ORAL_TABLET | Freq: Every day | ORAL | Status: DC

## 2015-12-29 MED ORDER — NAPROXEN 500 MG PO TABS: 500.0000 mg | ORAL_TABLET | Freq: Two times a day (BID) | ORAL | Status: DC
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
## 2015-12-29 NOTE — Patient Instructions (Addendum)
Take the cyclobenzaprine muscle relaxant one at bedtime. If it makes you drowsy too much, you can split it in half and take just one half pill.  Take the naproxen anti-inflammatory pain reliever one twice daily at breakfast and supper  Continue gentle stretching exercises  If not doing much better in 10-14 days please return for a recheck.    IF you received an x-ray today, you will receive an invoice from [facility]. Please contact [facility] at [phone] with questions or concerns regarding your invoice.   IF you received labwork today, you will receive an invoice from [employer]. Please contact [facility] at [phone] with questions or concerns regarding your invoice.   Our billing staff will not be able to assist you with questions regarding bills from these companies.  You will be contacted with the lab results as soon as they are available. The fastest way to get your results is to activate your My Chart account. Instructions are located on the last page of this paperwork. If you have not heard from [place] regarding the results in 2 weeks, please contact this office.

## 2016-01-06 ENCOUNTER — Ambulatory Visit (INDEPENDENT_AMBULATORY_CARE_PROVIDER_SITE_OTHER): Payer: PPO | Admitting: Family Medicine

## 2016-01-06 DIAGNOSIS — S39012A Strain of muscle, fascia and tendon of lower back, initial encounter: Secondary | ICD-10-CM | POA: Insufficient documentation

## 2016-01-06 DIAGNOSIS — S46911D Strain of unspecified muscle, fascia and tendon at shoulder and upper arm level, right arm, subsequent encounter: Secondary | ICD-10-CM

## 2016-01-06 DIAGNOSIS — S161XXD Strain of muscle, fascia and tendon at neck level, subsequent encounter: Secondary | ICD-10-CM | POA: Diagnosis not present

## 2016-01-06 DIAGNOSIS — S39012D Strain of muscle, fascia and tendon of lower back, subsequent encounter: Secondary | ICD-10-CM | POA: Diagnosis not present

## 2016-01-06 MED ORDER — MELOXICAM 15 MG PO TABS: 15.0000 mg | ORAL_TABLET | Freq: Every day | ORAL | Status: DC

## 2016-01-06 MED ORDER — DIAZEPAM 2 MG PO TABS: 2.0000 mg | ORAL_TABLET | Freq: Every evening | ORAL | Status: DC | PRN

## 2016-01-06 NOTE — Patient Instructions (Addendum)
IF you received an x-ray today, you will receive an invoice from [facility]. Please contact [facility] at [phone] with questions or concerns regarding your invoice.   IF you received labwork today, you will receive an invoice from [employer]. Please contact [facility] at [phone] with questions or concerns regarding your invoice.   Our billing staff will not be able to assist you with questions regarding bills from these companies.  You will be contacted with the lab results as soon as they are available. The fastest way to get your results is to activate your My Chart account. Instructions are located on the last page of this paperwork. If you have not heard from [place] regarding the results in 2 weeks, please contact this office.     Heat to your neck and back 15 minutes 4 times a day. Take the meloxicam every morning.  Do not use with any other otc pain medication other than tylenol/acetaminophen - so no aleve, ibuprofen, motrin, advil, etc. Try the diazepam at night to help sleep and help with muscle relaxants. If you are still having any pain in a month, make sure come back so we can further evaluate and treat. Make sure you ask the chiropractor to give you a copy of your xrays - that way if you continue to have problems, bring the disc with you into our clinic so that we can look at it and not end up repeating the same xrays.   Impingement Syndrome, Rotator Cuff, Bursitis With Rehab Impingement syndrome is a condition that involves inflammation of the tendons of the rotator cuff and the subacromial bursa, that causes pain in the shoulder. The rotator cuff consists of four tendons and muscles that control much of the shoulder and upper arm function. The subacromial bursa is a fluid filled sac that helps reduce friction between the rotator cuff and one of the bones of the shoulder (acromion). Impingement syndrome is usually an overuse  injury that causes swelling of the bursa (bursitis), swelling of the tendon (tendonitis), and/or a tear of the tendon (strain). Strains are classified into three categories. Grade 1 strains cause pain, but the tendon is not lengthened. Grade 2 strains include a lengthened ligament, due to the ligament being stretched or partially ruptured. With grade 2 strains there is still function, although the function may be decreased. Grade 3 strains include a complete tear of the tendon or muscle, and function is usually impaired. SYMPTOMS   Pain around the shoulder, often at the outer portion of the upper arm.  Pain that gets worse with shoulder function, especially when reaching overhead or lifting.  Sometimes, aching when not using the arm.  Pain that wakes you up at night.  Sometimes, tenderness, swelling, warmth, or redness over the affected area.  Loss of strength.  Limited motion of the shoulder, especially reaching behind the back (to the back pocket or to unhook bra) or across your body.  Crackling sound (crepitation) when moving the arm.  Biceps tendon pain and inflammation (in the front of the shoulder). Worse when bending the elbow or lifting. CAUSES  Impingement syndrome is often an overuse injury, in which chronic (repetitive) motions cause the tendons or bursa to become inflamed. A strain occurs when a force is paced on the tendon or muscle that is greater than it can withstand. Common mechanisms of injury include: Stress from sudden increase in duration, frequency, or intensity of training.  Direct hit (trauma) to the shoulder.  Aging, erosion of the tendon with normal use.  Bony bump on shoulder (acromial spur). RISK INCREASES WITH:  Contact sports (football, wrestling, boxing).  Throwing sports (baseball, tennis, volleyball).  Weightlifting and bodybuilding.  Heavy labor.  Previous injury to the rotator cuff, including impingement.  Poor shoulder strength and  flexibility.  Failure to warm up properly before activity.  Inadequate protective equipment.  Old age.  Bony bump on shoulder (acromial spur). PREVENTION   Warm up and stretch properly before activity.  Allow for adequate recovery between workouts.  Maintain physical fitness:  Strength, flexibility, and endurance.  Cardiovascular fitness.  Learn and use proper exercise technique. PROGNOSIS  If treated properly, impingement syndrome usually goes away within 6 weeks. Sometimes surgery is required.  RELATED COMPLICATIONS   Longer healing time if not properly treated, or if not given enough time to heal.  Recurring symptoms, that result in a chronic condition.  Shoulder stiffness, frozen shoulder, or loss of motion.  Rotator cuff tendon tear.  Recurring symptoms, especially if activity is resumed too soon, with overuse, with a direct blow, or when using poor technique. TREATMENT  Treatment first involves the use of ice and medicine, to reduce pain and inflammation. The use of strengthening and stretching exercises may help reduce pain with activity. These exercises may be performed at home or with a therapist. If non-surgical treatment is unsuccessful after more than 6 months, surgery may be advised. After surgery and rehabilitation, activity is usually possible in 3 months.  MEDICATION  If pain medicine is needed, nonsteroidal anti-inflammatory medicines (aspirin and ibuprofen), or other minor pain relievers (acetaminophen), are often advised.  Do not take pain medicine for 7 days before surgery.  Prescription pain relievers may be given, if your caregiver thinks they are needed. Use only as directed and only as much as you need.  Corticosteroid injections may be given by your caregiver. These injections should be reserved for the most serious cases, because they may only be given a certain number of times. HEAT AND COLD  Cold treatment (icing) should be applied for 10 to  15 minutes every 2 to 3 hours for inflammation and pain, and immediately after activity that aggravates your symptoms. Use ice packs or an ice massage.  Heat treatment may be used before performing stretching and strengthening activities prescribed by your caregiver, physical therapist, or athletic trainer. Use a heat pack or a warm water soak. SEEK MEDICAL CARE IF:   Symptoms get worse or do not improve in 4 to 6 weeks, despite treatment.  New, unexplained symptoms develop. (Drugs used in treatment may produce side effects.) EXERCISES  RANGE OF MOTION (ROM) AND STRETCHING EXERCISES - Impingement Syndrome (Rotator Cuff  Tendinitis, Bursitis) These exercises may help you when beginning to rehabilitate your injury. Your symptoms may go away with or without further involvement from your physician, physical therapist or athletic trainer. While completing these exercises, remember:   Restoring tissue flexibility helps normal motion to return to the joints. This allows healthier, less painful movement and activity.  An effective stretch should be held for at least 30 seconds.  A stretch should never be painful. You should only feel a gentle lengthening or release in the stretched tissue. STRETCH - Flexion, Standing  Stand with good posture. With an underhand grip on your right / left hand, and an overhand grip on the opposite hand, grasp a broomstick or cane so that your hands are a little more than shoulder width apart.  Keeping your  right / left elbow straight and shoulder muscles relaxed, push the stick with your opposite hand, to raise your right / left arm in front of your body and then overhead. Raise your arm until you feel a stretch in your right / left shoulder, but before you have increased shoulder pain.  Try to avoid shrugging your right / left shoulder as your arm rises, by keeping your shoulder blade tucked down and toward your mid-back spine. Hold for __________ seconds.  Slowly  return to the starting position. Repeat __________ times. Complete this exercise __________ times per day. STRETCH - Abduction, Supine  Lie on your back. With an underhand grip on your right / left hand and an overhand grip on the opposite hand, grasp a broomstick or cane so that your hands are a little more than shoulder width apart.  Keeping your right / left elbow straight and your shoulder muscles relaxed, push the stick with your opposite hand, to raise your right / left arm out to the side of your body and then overhead. Raise your arm until you feel a stretch in your right / left shoulder, but before you have increased shoulder pain.  Try to avoid shrugging your right / left shoulder as your arm rises, by keeping your shoulder blade tucked down and toward your mid-back spine. Hold for __________ seconds.  Slowly return to the starting position. Repeat __________ times. Complete this exercise __________ times per day. ROM - Flexion, Active-Assisted  Lie on your back. You may bend your knees for comfort.  Grasp a broomstick or cane so your hands are about shoulder width apart. Your right / left hand should grip the end of the stick, so that your hand is positioned "thumbs-up," as if you were about to shake hands.  Using your healthy arm to lead, raise your right / left arm overhead, until you feel a gentle stretch in your shoulder. Hold for __________ seconds.  Use the stick to assist in returning your right / left arm to its starting position. Repeat __________ times. Complete this exercise __________ times per day.  ROM - Internal Rotation, Supine   Lie on your back on a firm surface. Place your right / left elbow about 60 degrees away from your side. Elevate your elbow with a folded towel, so that the elbow and shoulder are the same height.  Using a broomstick or cane and your strong arm, pull your right / left hand toward your body until you feel a gentle stretch, but no increase  in your shoulder pain. Keep your shoulder and elbow in place throughout the exercise.  Hold for __________ seconds. Slowly return to the starting position. Repeat __________ times. Complete this exercise __________ times per day. STRETCH - Internal Rotation  Place your right / left hand behind your back, palm up.  Throw a towel or belt over your opposite shoulder. Grasp the towel with your right / left hand.  While keeping an upright posture, gently pull up on the towel, until you feel a stretch in the front of your right / left shoulder.  Avoid shrugging your right / left shoulder as your arm rises, by keeping your shoulder blade tucked down and toward your mid-back spine.  Hold for __________ seconds. Release the stretch, by lowering your healthy hand. Repeat __________ times. Complete this exercise __________ times per day. ROM - Internal Rotation   Using an underhand grip, grasp a stick behind your back with both hands.  While standing  upright with good posture, slide the stick up your back until you feel a mild stretch in the front of your shoulder.  Hold for __________ seconds. Slowly return to your starting position. Repeat __________ times. Complete this exercise __________ times per day.  STRETCH - Posterior Shoulder Capsule   Stand or sit with good posture. Grasp your right / left elbow and draw it across your chest, keeping it at the same height as your shoulder.  Pull your elbow, so your upper arm comes in closer to your chest. Pull until you feel a gentle stretch in the back of your shoulder.  Hold for __________ seconds. Repeat __________ times. Complete this exercise __________ times per day. STRENGTHENING EXERCISES - Impingement Syndrome (Rotator Cuff Tendinitis, Bursitis) These exercises may help you when beginning to rehabilitate your injury. They may resolve your symptoms with or without further involvement from your physician, physical therapist or athletic  trainer. While completing these exercises, remember:  Muscles can gain both the endurance and the strength needed for everyday activities through controlled exercises.  Complete these exercises as instructed by your physician, physical therapist or athletic trainer. Increase the resistance and repetitions only as guided.  You may experience muscle soreness or fatigue, but the pain or discomfort you are trying to eliminate should never worsen during these exercises. If this pain does get worse, stop and make sure you are following the directions exactly. If the pain is still present after adjustments, discontinue the exercise until you can discuss the trouble with your clinician.  During your recovery, avoid activity or exercises which involve actions that place your injured hand or elbow above your head or behind your back or head. These positions stress the tissues which you are trying to heal. STRENGTH - Scapular Depression and Adduction   With good posture, sit on a firm chair. Support your arms in front of you, with pillows, arm rests, or on a table top. Have your elbows in line with the sides of your body.  Gently draw your shoulder blades down and toward your mid-back spine. Gradually increase the tension, without tensing the muscles along the top of your shoulders and the back of your neck.  Hold for __________ seconds. Slowly release the tension and relax your muscles completely before starting the next repetition.  After you have practiced this exercise, remove the arm support and complete the exercise in standing as well as sitting position. Repeat __________ times. Complete this exercise __________ times per day.  STRENGTH - Shoulder Abductors, Isometric  With good posture, stand or sit about 4-6 inches from a wall, with your right / left side facing the wall.  Bend your right / left elbow. Gently press your right / left elbow into the wall. Increase the pressure gradually, until  you are pressing as hard as you can, without shrugging your shoulder or increasing any shoulder discomfort.  Hold for __________ seconds.  Release the tension slowly. Relax your shoulder muscles completely before you begin the next repetition. Repeat __________ times. Complete this exercise __________ times per day.  STRENGTH - External Rotators, Isometric  Keep your right / left elbow at your side and bend it 90 degrees.  Step into a door frame so that the outside of your right / left wrist can press against the door frame without your upper arm leaving your side.  Gently press your right / left wrist into the door frame, as if you were trying to swing the back of your hand  away from your stomach. Gradually increase the tension, until you are pressing as hard as you can, without shrugging your shoulder or increasing any shoulder discomfort.  Hold for __________ seconds.  Release the tension slowly. Relax your shoulder muscles completely before you begin the next repetition. Repeat __________ times. Complete this exercise __________ times per day.  STRENGTH - Supraspinatus   Stand or sit with good posture. Grasp a __________ weight, or an exercise band or tubing, so that your hand is "thumbs-up," like you are shaking hands.  Slowly lift your right / left arm in a "V" away from your thigh, diagonally into the space between your side and straight ahead. Lift your hand to shoulder height or as far as you can, without increasing any shoulder pain. At first, many people do not lift their hands above shoulder height.  Avoid shrugging your right / left shoulder as your arm rises, by keeping your shoulder blade tucked down and toward your mid-back spine.  Hold for __________ seconds. Control the descent of your hand, as you slowly return to your starting position. Repeat __________ times. Complete this exercise __________ times per day.  STRENGTH - External Rotators  Secure a rubber exercise  band or tubing to a fixed object (table, pole) so that it is at the same height as your right / left elbow when you are standing or sitting on a firm surface.  Stand or sit so that the secured exercise band is at your uninjured side.  Bend your right / left elbow 90 degrees. Place a folded towel or small pillow under your right / left arm, so that your elbow is a few inches away from your side.  Keeping the tension on the exercise band, pull it away from your body, as if pivoting on your elbow. Be sure to keep your body steady, so that the movement is coming only from your rotating shoulder.  Hold for __________ seconds. Release the tension in a controlled manner, as you return to the starting position. Repeat __________ times. Complete this exercise __________ times per day.  STRENGTH - Internal Rotators   Secure a rubber exercise band or tubing to a fixed object (table, pole) so that it is at the same height as your right / left elbow when you are standing or sitting on a firm surface.  Stand or sit so that the secured exercise band is at your right / left side.  Bend your elbow 90 degrees. Place a folded towel or small pillow under your right / left arm so that your elbow is a few inches away from your side.  Keeping the tension on the exercise band, pull it across your body, toward your stomach. Be sure to keep your body steady, so that the movement is coming only from your rotating shoulder.  Hold for __________ seconds. Release the tension in a controlled manner, as you return to the starting position. Repeat __________ times. Complete this exercise __________ times per day.  STRENGTH - Scapular Protractors, Standing   Stand arms length away from a wall. Place your hands on the wall, keeping your elbows straight.  Begin by dropping your shoulder blades down and toward your mid-back spine.  To strengthen your protractors, keep your shoulder blades down, but slide them forward on your  rib cage. It will feel as if you are lifting the back of your rib cage away from the wall. This is a subtle motion and can be challenging to complete. Ask your  caregiver for further instruction, if you are not sure you are doing the exercise correctly.  Hold for __________ seconds. Slowly return to the starting position, resting the muscles completely before starting the next repetition. Repeat __________ times. Complete this exercise __________ times per day. STRENGTH - Scapular Protractors, Supine  Lie on your back on a firm surface. Extend your right / left arm straight into the air while holding a __________ weight in your hand.  Keeping your head and back in place, lift your shoulder off the floor.  Hold for __________ seconds. Slowly return to the starting position, and allow your muscles to relax completely before starting the next repetition. Repeat __________ times. Complete this exercise __________ times per day. STRENGTH - Scapular Protractors, Quadruped  Get onto your hands and knees, with your shoulders directly over your hands (or as close as you can be, comfortably).  Keeping your elbows locked, lift the back of your rib cage up into your shoulder blades, so your mid-back rounds out. Keep your neck muscles relaxed.  Hold this position for __________ seconds. Slowly return to the starting position and allow your muscles to relax completely before starting the next repetition. Repeat __________ times. Complete this exercise __________ times per day.  STRENGTH - Scapular Retractors  Secure a rubber exercise band or tubing to a fixed object (table, pole), so that it is at the height of your shoulders when you are either standing, or sitting on a firm armless chair.  With a palm down grip, grasp an end of the band in each hand. Straighten your elbows and lift your hands straight in front of you, at shoulder height. Step back, away from the secured end of the band, until it becomes  tense.  Squeezing your shoulder blades together, draw your elbows back toward your sides, as you bend them. Keep your upper arms lifted away from your body throughout the exercise.  Hold for __________ seconds. Slowly ease the tension on the band, as you reverse the directions and return to the starting position. Repeat __________ times. Complete this exercise __________ times per day. STRENGTH - Shoulder Extensors   Secure a rubber exercise band or tubing to a fixed object (table, pole) so that it is at the height of your shoulders when you are either standing, or sitting on a firm armless chair.  With a thumbs-up grip, grasp an end of the band in each hand. Straighten your elbows and lift your hands straight in front of you, at shoulder height. Step back, away from the secured end of the band, until it becomes tense.  Squeezing your shoulder blades together, pull your hands down to the sides of your thighs. Do not allow your hands to go behind you.  Hold for __________ seconds. Slowly ease the tension on the band, as you reverse the directions and return to the starting position. Repeat __________ times. Complete this exercise __________ times per day.  STRENGTH - Scapular Retractors and External Rotators   Secure a rubber exercise band or tubing to a fixed object (table, pole) so that it is at the height as your shoulders, when you are either standing, or sitting on a firm armless chair.  With a palm down grip, grasp an end of the band in each hand. Bend your elbows 90 degrees and lift your elbows to shoulder height, at your sides. Step back, away from the secured end of the band, until it becomes tense.  Squeezing your shoulder blades together, rotate your  shoulders so that your upper arms and elbows remain stationary, but your fists travel upward to head height.  Hold for __________ seconds. Slowly ease the tension on the band, as you reverse the directions and return to the starting  position. Repeat __________ times. Complete this exercise __________ times per day.  STRENGTH - Scapular Retractors and External Rotators, Rowing   Secure a rubber exercise band or tubing to a fixed object (table, pole) so that it is at the height of your shoulders, when you are either standing, or sitting on a firm armless chair.  With a palm down grip, grasp an end of the band in each hand. Straighten your elbows and lift your hands straight in front of you, at shoulder height. Step back, away from the secured end of the band, until it becomes tense.  Step 1: Squeeze your shoulder blades together. Bending your elbows, draw your hands to your chest, as if you are rowing a boat. At the end of this motion, your hands and elbow should be at shoulder height and your elbows should be out to your sides.  Step 2: Rotate your shoulders, to raise your hands above your head. Your forearms should be vertical and your upper arms should be horizontal.  Hold for __________ seconds. Slowly ease the tension on the band, as you reverse the directions and return to the starting position. Repeat __________ times. Complete this exercise __________ times per day.  STRENGTH - Scapular Depressors  Find a sturdy chair without wheels, such as a dining room chair.  Keeping your feet on the floor, and your hands on the chair arms, lift your bottom up from the seat, and lock your elbows.  Keeping your elbows straight, allow gravity to pull your body weight down. Your shoulders will rise toward your ears.  Raise your body against gravity by drawing your shoulder blades down your back, shortening the distance between your shoulders and ears. Although your feet should always maintain contact with the floor, your feet should progressively support less body weight, as you get stronger.  Hold for __________ seconds. In a controlled and slow manner, lower your body weight to begin the next repetition. Repeat __________  times. Complete this exercise __________ times per day.    This information is not intended to replace advice given to you by your health care provider. Make sure you discuss any questions you have with your health care provider.   Document Released: 10/01/2005 Document Revised: 10/22/2014 Document Reviewed: 01/13/2009 [employer] Interactive Patient Education [employer].
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
## 2016-07-25 ENCOUNTER — Ambulatory Visit (INDEPENDENT_AMBULATORY_CARE_PROVIDER_SITE_OTHER): Payer: PPO | Admitting: Family Medicine

## 2016-07-25 VITALS — BP 110/70 | HR 97 | Temp 98.7°F | Resp 16 | Ht 66.0 in | Wt 173.0 lb

## 2016-07-25 DIAGNOSIS — B079 Viral wart, unspecified: Secondary | ICD-10-CM | POA: Diagnosis not present

## 2016-07-25 DIAGNOSIS — H60392 Other infective otitis externa, left ear: Secondary | ICD-10-CM

## 2016-07-25 DIAGNOSIS — R202 Paresthesia of skin: Secondary | ICD-10-CM | POA: Diagnosis not present

## 2016-07-25 MED ORDER — NEOMYCIN-POLYMYXIN-HC 1 % OT SOLN: 3.0000 [drp] | Freq: Four times a day (QID) | OTIC | 0 refills | Status: DC

## 2016-07-25 NOTE — Patient Instructions (Signed)
Use the over the counter salicylic acid drops on your wart as per the instructions.  This will form a blister where we did the cold spray.  We are checking your thryoid and blood sugar today.  We will let you know the results.  Use the cortisporin drops for you Right ear and make sure to clean your headphones.     Warts Warts are small growths on the skin. They are common and can occur on various areas of the body. A person may have one wart or multiple warts. Most warts are not painful, and they usually do not cause problems. However, warts can cause pain if they are large or occur in an area of the body where pressure will be applied to them, such as the bottom of the foot. In many cases, warts do not require treatment. They usually go away on their own over a period of many months to a couple years. Various treatments may be done for warts that cause problems or do not go away. Sometimes, warts go away and then come back again. CAUSES Warts are caused by a type of virus that is called human papillomavirus (HPV). This virus can spread from person to person through direct contact. Warts can also spread to other areas of the body when a person scratches a wart and then scratches another area of his or her body.  RISK FACTORS Warts are more likely to develop in:  People who are 8-108 years of age.  People who have a weakened body defense system (immune system). SYMPTOMS A wart may be round or oval or have an irregular shape. Most warts have a rough surface. Warts may range in color from skin color to light yellow, brown, or gray. They are generally less than  inch (1.3 cm) in size. Most warts are painless, but some can be painful when pressure is applied to them. DIAGNOSIS A wart can usually be diagnosed from its appearance. In some cases, a tissue sample may be removed (biopsy) to be looked at under a microscope. TREATMENT In many cases, warts do not need treatment. If treatment is needed,  options may include:  Applying medicated solutions, creams, or patches to the wart. These may be over-the-counter or prescription medicines that make the skin soft so that layers will gradually shed away. In many cases, the medicine is applied one or two times per day and covered with a bandage.  Putting duct tape over the top of the wart (occlusion). You will leave the tape in place for as long as told by your health care provider, then you will replace it with a new strip of tape. This is done until the wart goes away.  Freezing the wart with liquid nitrogen (cryotherapy).  Burning the wart with:  Laser treatment.  An electrified probe (electrocautery).  Injection of a medicine (Candida antigen) into the wart to help the body's immune system to fight off the wart.  Surgery to remove the wart. HOME CARE INSTRUCTIONS  Apply over-the-counter and prescription medicines only as told by your health care provider.  Do not apply over-the-counter wart medicines to your face or genitals before you ask your health care provider if it is okay to do so.  Do not scratch or pick at a wart.  Wash your hands after you touch a wart.  Avoid shaving hair that is over a wart.  Keep all follow-up visits as told by your health care provider. This is important. [place]  IF:  Your warts do not improve after treatment.  You have redness, swelling, or pain at the site of a wart.  You have bleeding from a wart that does not stop with light pressure.  You have diabetes and you develop a wart.   This information is not intended to replace advice given to you by your health care provider. Make sure you discuss any questions you have with your health care provider.   Document Released: 07/11/2005 Document Revised: 06/22/2015 Document Reviewed: 12/27/2014 Elsevier Interactive Patient Education [employer].

## 2016-07-25 NOTE — Progress Notes (Signed)
   Samuel Barker is a 25 y.o. male who presents to [facility] today for foot problem:  1.  Foot problem:  Has ben having a "burning sensation" in his feet for past 4-5 days.  Started when the weather changed.  Has never had anything like this previously.  States his feet get sweaty sometimes at night.  No actual pins and needles in his feet.  No pain when he tries to walk.  No polyuira/polydipsia/polyphagia.  No nocturia.  2.  Ear pain:  Left ear present for past several days.  Wears ear buds.  No drainage from ear.  No fevers or chills.  No runny nose or runny eyes.  No sore throat.    ROS as above.   PMH reviewed. Patient is a nonsmoker.   No past medical history on file. No past surgical history on file.  Medications reviewed. Current Outpatient Prescriptions  Medication Sig Dispense Refill  . NEOMYCIN-POLYMYXIN-HYDROCORTISONE (CORTISPORIN) 1 % SOLN otic solution Place 3 drops into the left ear every 6 (six) hours. 10 mL 0   No current facility-administered medications for this visit.      Physical Exam:  BP 110/70 (BP Location: Left Arm, Patient Position: Sitting, Cuff Size: Large)   Pulse 97   Temp 98.7 F (37.1 C) (Oral)   Resp 16   Ht 5\' 6"  (1.676 m)   Wt 173 lb (78.5 kg)   SpO2 97%   BMI 27.92 kg/m  Gen:  Patient sitting on exam table, appears stated age in no acute distress Head: Normocephalic atraumatic Eyes: EOMI, PERRL, sclera and conjunctiva non-erythematous Ears:  Canals clear bilaterally.  Right ear WNL.  Left ear with some erythema and edema to canal  Mouth: Mucosa membranes moist. Tonsils +2, nonenlarged, non-erythematous. Neck: No cervical lymphadenopathy noted Skin:  0.3 cm wart noted Right PIP joint index finger Feet:  DP/PT pulses +2 BL.  Sensation fully intact.  Ambulates well.  No pain or tenderness on palpation.  Does have some mild heat bases of bilateral feet, wearing shoes.  No erythema or edema    Assessment and Plan:  1.   Paresthesias: - possibly also just warm feet from change back to warm weather past several days.  Has never had this previously.  - Likely not true paresthesias secondary to systemic issue.  Checking thyroid and CBG/BMET today.  - will call with results.  Provided reassurance otherwise.   2.  Wart: - treated with cryotherapy here.  Patient declined returning for repeat treatment, will try salicylic acid   3.  Otitis externa: - cortisporin otic to treat.  Plus cleaning of the earbuds he regularly uses.  FU if no improvement.

## 2016-07-26 ENCOUNTER — Ambulatory Visit (INDEPENDENT_AMBULATORY_CARE_PROVIDER_SITE_OTHER): Payer: PPO | Admitting: Family Medicine

## 2016-07-26 VITALS — BP 116/74 | HR 82 | Temp 98.6°F | Resp 17 | Ht 66.0 in | Wt 172.0 lb

## 2016-07-26 DIAGNOSIS — B079 Viral wart, unspecified: Secondary | ICD-10-CM | POA: Diagnosis not present

## 2016-07-26 LAB — CBC
HCT: 48.7 % (ref 38.5–50.0)
Hemoglobin: 17.3 g/dL — ABNORMAL HIGH (ref 13.2–17.1)
MCH: 31 pg (ref 27.0–33.0)
MCHC: 35.5 g/dL (ref 32.0–36.0)
MCV: 87.3 fL (ref 80.0–100.0)
MPV: 11.2 fL (ref 7.5–12.5)
PLATELETS: 175 10*3/uL (ref 140–400)
RBC: 5.58 MIL/uL (ref 4.20–5.80)
RDW: 13.6 % (ref 11.0–15.0)
WBC: 7 10*3/uL (ref 3.8–10.8)

## 2016-07-26 LAB — BASIC METABOLIC PANEL
BUN: 13 mg/dL (ref 7–25)
CHLORIDE: 104 mmol/L (ref 98–110)
CO2: 27 mmol/L (ref 20–31)
Calcium: 9.7 mg/dL (ref 8.6–10.3)
Creat: 0.83 mg/dL (ref 0.60–1.35)
Glucose, Bld: 68 mg/dL (ref 65–99)
Potassium: 3.9 mmol/L (ref 3.5–5.3)
SODIUM: 141 mmol/L (ref 135–146)

## 2016-07-26 LAB — TSH: TSH: 1.64 mIU/L (ref 0.40–4.50)

## 2016-07-26 NOTE — Progress Notes (Signed)
   Samuel Barker is a 25 y.o. male who presents to [facility] today for lab results and FU for wart:  1.  Wart removal:  Here yesterday to have this removed.  Cryotherapy applied yesterday. Has not had a blister formation since yesterday. Like to have this attempted again today. He has not picked up the salicylic acid either.  #2. Lab results: Patient like his lab results from yesterday. Still has warm feet at night.  Not worsened from yesterday and no worse than has been.  ROS as above.    PMH reviewed. Patient is a nonsmoker.   No past medical history on file. No past surgical history on file.  Medications reviewed. Current Outpatient Prescriptions  Medication Sig Dispense Refill  . NEOMYCIN-POLYMYXIN-HYDROCORTISONE (CORTISPORIN) 1 % SOLN otic solution Place 3 drops into the left ear every 6 (six) hours. 10 mL 0   No current facility-administered medications for this visit.      Physical Exam:  BP 116/74 (BP Location: Right Arm, Patient Position: Sitting, Cuff Size: Normal)   Pulse 82   Temp 98.6 F (37 C) (Oral)   Resp 17   Ht 5\' 6"  (1.676 m)   Wt 172 lb (78 kg)   SpO2 98%   BMI 27.76 kg/m  Gen:  Alert, cooperative patient who appears stated age in no acute distress.  Vital signs reviewed. Skin:  3-4 mm wart index of left hand. Cryotherapy applied today.   Assessment and Plan:  1.  Wart removal: Cryotherapy applied again today. Recommended he wait at least a week before trying cryotherapy again prevent burning of skin. He can start salicylic acid tomorrow or return in about a week for repeat cryotherapy.   Lab results: This and not returned. Will let him know when they have.

## 2016-07-26 NOTE — Patient Instructions (Signed)
     IF you received an x-ray today, you will receive an invoice from [facility]. Please contact [facility] at [phone] with questions or concerns regarding your invoice.   IF you received labwork today, you will receive an invoice from [facility]. Please contact [facility] at [phone] with questions or concerns regarding your invoice.   Our billing staff will not be able to assist you with questions regarding bills from these companies.  You will be contacted with the lab results as soon as they are available. The fastest way to get your results is to activate your My Chart account. Instructions are located on the last page of this paperwork. If you have not heard from us regarding the results in 2 weeks, please contact this office.      

## 2016-08-13 ENCOUNTER — Ambulatory Visit (INDEPENDENT_AMBULATORY_CARE_PROVIDER_SITE_OTHER): Payer: PPO | Admitting: Family Medicine

## 2016-08-13 VITALS — BP 120/68 | HR 77 | Temp 98.5°F | Resp 17 | Ht 66.0 in | Wt 176.0 lb

## 2016-08-13 DIAGNOSIS — B079 Viral wart, unspecified: Secondary | ICD-10-CM

## 2016-08-13 DIAGNOSIS — D229 Melanocytic nevi, unspecified: Secondary | ICD-10-CM

## 2016-08-13 NOTE — Progress Notes (Signed)
   Patient ID: Samuel Barker, male    DOB: 1990/11/03, 25 y.o.   MRN: [id number]  PCP: No PCP Per Patient  Chief Complaint  Patient presents with  . Follow-up    Wart on left hand 1st finger    Subjective:   HPI 25 year old presents for evaluation of left finger wart that was previously treated with cryotherapy here at [facility] on 07/26/2016. He reports that the wart color changed but size of the wart never change or decreased.  He developed a wart on left 2nd finger (index) about a year ago. He did not purchase the salicate acid to apply to the finger after the cryotherapy was applied.He would like several moles on his face and skin discoloration on his arm evaluated and requests a dermatology referral.  Social History   Social History  . Marital status: Single    Spouse name: N/A  . Number of children: N/A  . Years of education: N/A   Occupational History  . Not on file.   Social History Main Topics  . Smoking status: Current Every Day Smoker    Packs/day: 1.00    Types: Cigarettes  . Smokeless tobacco: Never Used  . Alcohol use No  . Drug use: No  . Sexual activity: Not on file   Other Topics Concern  . Not on file   Social History Narrative  . No narrative on file    Family History  Problem Relation Age of Onset  . Diabetes Mother   . Diabetes Father   . Diabetes Maternal Grandmother   . Diabetes Maternal Grandfather   . Diabetes Paternal Grandmother   . Heart disease Paternal Grandmother   . Diabetes Paternal Grandfather    Review of Systems See HPI  Patient Active Problem List   Diagnosis Date Noted  . Low back strain 01/06/2016   Prior to Admission medications   Not on File   No Known Allergies     Objective:  Physical Exam  Constitutional: He is oriented to person, place, and time. He appears well-developed and well-nourished.  HENT:  Head: Normocephalic and atraumatic.  Right Ear: External ear normal.  Left Ear: External ear normal.  Nose:  Nose normal.  Eyes: Conjunctivae are normal. Pupils are equal, round, and reactive to light.  Neck: Normal range of motion. Neck supple.  Cardiovascular: Normal rate.   Pulmonary/Chest: Effort normal.  Musculoskeletal: Normal range of motion.  Neurological: He is alert and oriented to person, place, and time.  Skin: Skin is warm and dry.   Procedure note: Informed consent was obtained.  The wart was treated with with cryotherapy using liquid nitrogen applied x 3 times to the core of ear wart. The color changed to white with the application of acid.  The patient tolerated procedure well and aftercare instructions were given to the patient.  Vitals:   08/13/16 1653  BP: 120/68  Pulse: 77  Resp: 17  Temp: 98.5 F (36.9 C)     Assessment & Plan:  1. Multiple nevi - Ambulatory referral to Dermatology  2. Viral warts, unspecified type Plan: -Cryotherapy performed and patient tolerated. -Obtain Salicate acid and apply to [place]. Kenton Kingfisher, MSN, FNP-C [facility]

## 2016-08-13 NOTE — Patient Instructions (Addendum)
I have referred you to dermatology for evaluation of moles on eye lids and areas on right arm.  Purchase salicylic acid and apply to wart twice daily.   IF you received an x-ray today, you will receive an invoice from [facility]. Please contact [facility] at [phone] with questions or concerns regarding your invoice.   IF you received labwork today, you will receive an invoice from [facility]. Please contact [facility] at [phone] with questions or concerns regarding your invoice.   Our billing staff will not be able to assist you with questions regarding bills from these companies.  You will be contacted with the lab results as soon as they are available. The fastest way to get your results is to activate your My Chart account. Instructions are located on the last page of this paperwork. If you have not heard from [place] regarding the results in 2 weeks, please contact this office.       Cryotherapy Cryotherapy is when you put ice on your injury. Ice helps lessen pain and puffiness (swelling) after an injury. Ice works the best when you start using it in the first 24 to 48 hours after an injury. HOME CARE  Put a dry or damp towel between the ice pack and your skin.  You may press gently on the ice pack.  Leave the ice on for no more than 10 to 20 minutes at a time.  Check your skin after 5 minutes to make sure your skin is okay.  Rest at least 20 minutes between ice pack uses.  Stop using ice when your skin loses feeling (numbness).  Do not use ice on someone who cannot tell you when it hurts. This includes small children and people with memory problems (dementia). GET HELP RIGHT AWAY IF:  You have white spots on your skin.  Your skin turns blue or pale.  Your skin feels waxy or hard.  Your puffiness gets worse. MAKE SURE YOU:   Understand these instructions.  Will watch your condition.  Will get help right away if you  are not doing well or get worse.   This information is not intended to replace advice given to you by your health care provider. Make sure you discuss any questions you have with your health care provider.   Document Released: 03/19/2008 Document Revised: 12/24/2011 Document Reviewed: 05/24/2011 Elsevier Interactive Patient Education 2016 Elsevier Inc[employer]  Plantar Warts Plantar warts are small growths on the bottom of the foot (sole). Warts are caused by a type of germ (virus). Most warts are not painful, and they usually do not cause problems. Sometimes, plantar warts can cause pain when you walk. Warts often go away on their own in time. Treatments may be done if needed. HOME CARE General Instructions  Apply creams or solutions only as told by your doctor. Follow these steps if your doctor tells you to do so:  Soak your foot in warm water.  Remove the top layer of softened skin before you apply the medicine. You can use a pumice stone to remove the tissue.  After you apply the medicine, put a bandage over the area of the wart.  Repeat the process every day or as told by your doctor.  Do not scratch or pick at a wart.  Wash your hands after you touch a wart.  If a wart is painful, try putting a bandage with a hole in the middle over the wart.  Keep all follow-up visits  as told by your doctor. This is important. Prevention  Wear shoes and socks. Change socks every day.  Keep your feet clean and dry.  Check your feet often.  Avoid direct contact with warts on other people. GET HELP IF:  Your warts do not improve after treatment.  You have redness, swelling, or pain at the site of a wart.  You have bleeding from a wart, and the bleeding does not stop when you put light pressure on the wart.  You have diabetes and you get a wart.   This information is not intended to replace advice given to you by your health care provider. Make sure you discuss any questions you have with  your health care provider.   Document Released: 11/03/2010 Document Revised: 06/22/2015 Document Reviewed: 12/27/2014 Elsevier Interactive Patient Education [employer].

## 2016-11-13 ENCOUNTER — Ambulatory Visit (INDEPENDENT_AMBULATORY_CARE_PROVIDER_SITE_OTHER): Payer: PPO | Admitting: Family Medicine

## 2016-11-13 VITALS — BP 122/72 | HR 120 | Temp 98.8°F | Resp 17 | Ht 66.0 in | Wt 187.0 lb

## 2016-11-13 DIAGNOSIS — K5900 Constipation, unspecified: Secondary | ICD-10-CM

## 2016-11-13 DIAGNOSIS — K644 Residual hemorrhoidal skin tags: Secondary | ICD-10-CM

## 2016-11-13 MED ORDER — HYDROCORTISONE 2.5 % RE CREA: 1.0000 "application " | TOPICAL_CREAM | Freq: Two times a day (BID) | RECTAL | 0 refills | Status: DC

## 2016-11-13 NOTE — Patient Instructions (Addendum)
There is a small hemorrhoid that should improve if not further constipation. Ok to use Anusol cream that was prescribed, but if any worsening in next few days, or swelling that does not go down - return for recheck.  Return to the clinic or go to the nearest emergency room if any of your symptoms worsen or new symptoms occur.  About Hemorrhoids  Hemorrhoids are swollen veins in the lower rectum and anus.  Also called piles, hemorrhoids are a common problem.  Hemorrhoids may be internal (inside the rectum) or external (around the anus).  Internal Hemorrhoids  Internal hemorrhoids are often painless, but they rarely cause bleeding.  The internal veins may stretch and fall down (prolapse) through the anus to the outside of the body.  The veins may then become irritated and painful.  External Hemorrhoids  External hemorrhoids can be easily seen or felt around the anal opening.  They are under the skin around the anus.  When the swollen veins are scratched or broken by straining, rubbing or wiping they sometimes bleed.  How Hemorrhoids Occur  Veins in the rectum and around the anus tend to swell under pressure.  Hemorrhoids can result from increased pressure in the veins of your anus or rectum.  Some sources of pressure are:   Straining to have a bowel movement because of constipation  Waiting too long to have a bowel movement  Coughing and sneezing often  Sitting for extended periods of time, including on the toilet  Diarrhea  Obesity  Trauma or injury to the anus  Some liver diseases  Stress  Family history of hemorrhoids  Pregnancy  Pregnant women should try to avoid becoming constipated, because they are more likely to have hemorrhoids during pregnancy.  In the last trimester of pregnancy, the enlarged uterus may press on blood vessels and causes hemorrhoids.  In addition, the strain of childbirth sometimes causes hemorrhoids after the birth.  Symptoms of Hemorrhoids  Some  symptoms of hemorrhoids include:  Swelling and/or a tender lump around the anus  Itching, mild burning and bleeding around the anus  Painful bowel movements with or without constipation  Bright red blood covering the stool, on toilet paper or in the toilet bowel.   Symptoms usually go away within a few days.  Always talk to your doctor about any bleeding to make sure it is not from some other causes.  Diagnosing and Treating Hemorrhoids  Diagnosis is made by an examination by your healthcare provider.  Special test can be performed by your doctor.    Most cases of hemorrhoids can be treated with:  High-fiber diet: Eat more high-fiber foods, which help prevent constipation.  Ask for more detailed fiber information on types and sources of fiber from your healthcare provider.  Fluids: Drink plenty of water.  This helps soften bowel movements so they are easier to pass.  Sitz baths and cold packs: Sitting in lukewarm water two or three times a day for 15 minutes cleases the anal area and may relieve discomfort.  If the water is too hot, swelling around the anus will get worse.  Placing a cloth-covered ice pack on the anus for ten minutes four times a day can also help reduce selling.  Gently pushing a prolapsed hemorrhoid back inside after the bath or ice pack can be helpful.  Medications: For mild discomfort, your healthcare provider may suggest over-the-counter pain medication or prescribe a cream or ointment for topical use.  The cream may contain witch  hazel, zinc oxide or petroleum jelly.  Medicated suppositories are also a treatment option.  Always consult your doctor before applying medications or creams.  Procedures and surgeries: There are also a number of procedures and surgeries to shrink or remove hemorrhoids in more serious cases.  Talk to your physician about these options.  You can often prevent hemorrhoids or keep them from becoming worse by maintaining a healthy lifestyle.   Eat a fiber-rich diet of fruits, vegetables and whole grains.  Also, drink plenty of water and exercise regularly.   2007, [employer] Doc.30   Constipation, Adult Constipation is when a person has fewer bowel movements in a week than normal, has difficulty having a bowel movement, or has stools that are dry, hard, or larger than normal. Constipation may be caused by an underlying condition. It may become worse with age if a person takes certain medicines and does not take in enough fluids. Follow these instructions at home: Eating and drinking  Eat foods that have a lot of fiber, such as fresh fruits and vegetables, whole grains, and beans.  Limit foods that are high in fat, low in fiber, or overly processed, such as french fries, hamburgers, cookies, candies, and soda.  Drink enough fluid to keep your urine clear or pale yellow. General instructions  Exercise regularly or as told by your health care provider.  Go to the restroom when you have the urge to go. Do not hold it in.  Take over-the-counter and prescription medicines only as told by your health care provider. These include any fiber supplements.  Practice pelvic floor retraining exercises, such as deep breathing while relaxing the lower abdomen and pelvic floor relaxation during bowel movements.  Watch your condition for any changes.  Keep all follow-up visits as told by your health care provider. This is important. Contact a health care provider if:  You have pain that gets worse.  You have a fever.  You do not have a bowel movement after 4 days.  You vomit.  You are not hungry.  You lose weight.  You are bleeding from the anus.  You have thin, pencil-like stools. Get help right away if:  You have a fever and your symptoms suddenly get worse.  You leak stool or have blood in your stool.  Your abdomen is bloated.  You have severe pain in your abdomen.  You feel dizzy or you faint. This  information is not intended to replace advice given to you by your health care provider. Make sure you discuss any questions you have with your health care provider. Document Released: 06/29/2004 Document Revised: 04/20/2016 Document Reviewed: 03/21/2016 Elsevier Interactive Patient Education  2017 [employer].    IF you received an x-ray today, you will receive an invoice from [facility]. Please contact [facility] at [phone] with questions or concerns regarding your invoice.   IF you received labwork today, you will receive an invoice from [facility]. Please contact [facility] at [phone] with questions or concerns regarding your invoice.   Our billing staff will not be able to assist you with questions regarding bills from these companies.  You will be contacted with the lab results as soon as they are available. The fastest way to get your results is to activate your My Chart account. Instructions are located on the last page of this paperwork. If you have not heard from [place] regarding the results in 2 weeks, please contact this office.

## 2016-11-13 NOTE — Progress Notes (Signed)
By signing my name below, I, Mesha Guinyard, attest that this documentation has been prepared under the direction and in the presence of Merri Ray, MD.  Electronically Signed: Verlee Monte, Medical Scribe. 11/13/16. 3:02 PM.  Subjective:    Patient ID: Samuel Barker, male    DOB: Oct 28, 1990, 26 y.o.   MRN: [id number]  HPI Chief Complaint  Patient presents with  . Hemorrhoids    HPI Comments: Samuel Barker is a 26 y.o. male who presents to the [facility] complaining of possible hemorrhoids onset yesterday morning. Pt's native language is Arabic and he speaks [place] - pt admits he's nott as knowledgeable about medical terms. Pt reports associated sxs of constipation 2 days ago, a bump on his rectum found yesterday, rectal soreness that's felt when he walks, and dizziness. Pt drank some olive oil before he went to the bathroom today and he had regular stool. Denies abdominal pain and melena.   Patient Active Problem List   Diagnosis Date Noted  . Low back strain 01/06/2016   No past medical history on file. No past surgical history on file. No Known Allergies Prior to Admission medications   Not on File   Social History   Social History  . Marital status: Single    Spouse name: N/A  . Number of children: N/A  . Years of education: N/A   Occupational History  . Not on file.   Social History Main Topics  . Smoking status: Current Every Day Smoker    Packs/day: 1.00    Types: Cigarettes  . Smokeless tobacco: Never Used  . Alcohol use No  . Drug use: No  . Sexual activity: Not on file   Other Topics Concern  . Not on file   Social History Narrative  . No narrative on file   Review of Systems  Gastrointestinal: Positive for constipation and rectal pain. Negative for abdominal pain and blood in stool.  Neurological: Positive for dizziness.   Objective:  Physical Exam  Constitutional: He appears well-developed and well-nourished. No  distress.  HENT:  Head: Normocephalic and atraumatic.  Eyes: Conjunctivae are normal.  Neck: Neck supple.  Cardiovascular: Normal rate, regular rhythm and normal heart sounds.  Exam reveals no gallop and no friction rub.   No murmur heard. Pulmonary/Chest: Effort normal and breath sounds normal. No respiratory distress. He has no wheezes. He has no rales.  Abdominal: Soft. Bowel sounds are normal. There is no tenderness. There is no rebound, no guarding and no CVA tenderness.  Genitourinary: Rectal exam shows external hemorrhoid and tenderness. Rectal exam shows no fissure and anal tone normal.     Neurological: He is alert.  Skin: Skin is warm and dry.  Psychiatric: He has a normal mood and affect. His behavior is normal.  Nursing note and vitals reviewed.  BP 122/72 (BP Location: Right Arm, Patient Position: Sitting, Cuff Size: Normal)   Pulse (!) 120   Temp 98.8 F (37.1 C) (Oral)   Resp 17   Ht 5\' 6"  (1.676 m)   Wt 187 lb (84.8 kg)   SpO2 97%   BMI 30.18 kg/m  Assessment & Plan:    Samuel Barker is a 26 y.o. male External hemorrhoids without complication - Plan: hydrocortisone (ANUSOL-HC) 2.5 % rectal cream  Constipation, unspecified constipation type  Suspected constipation causing small external nonthrombosed hemorrhoid.  Now improving. Handout given on constipation prevention, Anusol HC as needed for current small hemorrhoid. Prevention measures and current treatment  discussed. RTC precautions given.  Meds ordered this encounter  Medications  . hydrocortisone (ANUSOL-HC) 2.5 % rectal cream    Sig: Place 1 application rectally 2 (two) times daily.    Dispense:  30 g    Refill:  0   Patient Instructions   There is a small hemorrhoid that should improve if not further constipation. Ok to use Anusol cream that was prescribed, but if any worsening in next few days, or swelling that does not go down - return for recheck.  Return to the clinic or go to the nearest  emergency room if any of your symptoms worsen or new symptoms occur.  About Hemorrhoids  Hemorrhoids are swollen veins in the lower rectum and anus.  Also called piles, hemorrhoids are a common problem.  Hemorrhoids may be internal (inside the rectum) or external (around the anus).  Internal Hemorrhoids  Internal hemorrhoids are often painless, but they rarely cause bleeding.  The internal veins may stretch and fall down (prolapse) through the anus to the outside of the body.  The veins may then become irritated and painful.  External Hemorrhoids  External hemorrhoids can be easily seen or felt around the anal opening.  They are under the skin around the anus.  When the swollen veins are scratched or broken by straining, rubbing or wiping they sometimes bleed.  How Hemorrhoids Occur  Veins in the rectum and around the anus tend to swell under pressure.  Hemorrhoids can result from increased pressure in the veins of your anus or rectum.  Some sources of pressure are:   Straining to have a bowel movement because of constipation  Waiting too long to have a bowel movement  Coughing and sneezing often  Sitting for extended periods of time, including on the toilet  Diarrhea  Obesity  Trauma or injury to the anus  Some liver diseases  Stress  Family history of hemorrhoids  Pregnancy  Pregnant women should try to avoid becoming constipated, because they are more likely to have hemorrhoids during pregnancy.  In the last trimester of pregnancy, the enlarged uterus may press on blood vessels and causes hemorrhoids.  In addition, the strain of childbirth sometimes causes hemorrhoids after the birth.  Symptoms of Hemorrhoids  Some symptoms of hemorrhoids include:  Swelling and/or a tender lump around the anus  Itching, mild burning and bleeding around the anus  Painful bowel movements with or without constipation  Bright red blood covering the stool, on toilet paper or in the  toilet bowel.   Symptoms usually go away within a few days.  Always talk to your doctor about any bleeding to make sure it is not from some other causes.  Diagnosing and Treating Hemorrhoids  Diagnosis is made by an examination by your healthcare provider.  Special test can be performed by your doctor.    Most cases of hemorrhoids can be treated with:  High-fiber diet: Eat more high-fiber foods, which help prevent constipation.  Ask for more detailed fiber information on types and sources of fiber from your healthcare provider.  Fluids: Drink plenty of water.  This helps soften bowel movements so they are easier to pass.  Sitz baths and cold packs: Sitting in lukewarm water two or three times a day for 15 minutes cleases the anal area and may relieve discomfort.  If the water is too hot, swelling around the anus will get worse.  Placing a cloth-covered ice pack on the anus for ten minutes four times a  day can also help reduce selling.  Gently pushing a prolapsed hemorrhoid back inside after the bath or ice pack can be helpful.  Medications: For mild discomfort, your healthcare provider may suggest over-the-counter pain medication or prescribe a cream or ointment for topical use.  The cream may contain witch hazel, zinc oxide or petroleum jelly.  Medicated suppositories are also a treatment option.  Always consult your doctor before applying medications or creams.  Procedures and surgeries: There are also a number of procedures and surgeries to shrink or remove hemorrhoids in more serious cases.  Talk to your physician about these options.  You can often prevent hemorrhoids or keep them from becoming worse by maintaining a healthy lifestyle.  Eat a fiber-rich diet of fruits, vegetables and whole grains.  Also, drink plenty of water and exercise regularly.   2007, [employer] Doc.30   Constipation, Adult Constipation is when a person has fewer bowel movements in a week than  normal, has difficulty having a bowel movement, or has stools that are dry, hard, or larger than normal. Constipation may be caused by an underlying condition. It may become worse with age if a person takes certain medicines and does not take in enough fluids. Follow these instructions at home: Eating and drinking  Eat foods that have a lot of fiber, such as fresh fruits and vegetables, whole grains, and beans.  Limit foods that are high in fat, low in fiber, or overly processed, such as french fries, hamburgers, cookies, candies, and soda.  Drink enough fluid to keep your urine clear or pale yellow. General instructions  Exercise regularly or as told by your health care provider.  Go to the restroom when you have the urge to go. Do not hold it in.  Take over-the-counter and prescription medicines only as told by your health care provider. These include any fiber supplements.  Practice pelvic floor retraining exercises, such as deep breathing while relaxing the lower abdomen and pelvic floor relaxation during bowel movements.  Watch your condition for any changes.  Keep all follow-up visits as told by your health care provider. This is important. Contact a health care provider if:  You have pain that gets worse.  You have a fever.  You do not have a bowel movement after 4 days.  You vomit.  You are not hungry.  You lose weight.  You are bleeding from the anus.  You have thin, pencil-like stools. Get help right away if:  You have a fever and your symptoms suddenly get worse.  You leak stool or have blood in your stool.  Your abdomen is bloated.  You have severe pain in your abdomen.  You feel dizzy or you faint. This information is not intended to replace advice given to you by your health care provider. Make sure you discuss any questions you have with your health care provider. Document Released: 06/29/2004 Document Revised: 04/20/2016 Document Reviewed:  03/21/2016 Elsevier Interactive Patient Education  2017 [employer].    IF you received an x-ray today, you will receive an invoice from [facility]. Please contact [facility] at [phone] with questions or concerns regarding your invoice.   IF you received labwork today, you will receive an invoice from [facility]. Please contact [facility] at [phone] with questions or concerns regarding your invoice.   Our billing staff will not be able to assist you with questions regarding bills from these companies.  You will be contacted with the lab results as soon as  they are available. The fastest way to get your results is to activate your My Chart account. Instructions are located on the last page of this paperwork. If you have not heard from [place] regarding the results in 2 weeks, please contact this office.       I personally performed the services described in this documentation, which was scribed in my presence. The recorded information has been reviewed and considered for accuracy and completeness, addended by me as needed, and agree with information above.  Signed,   Merri Ray, MD [facility] [place].  11/13/16 3:20 PM

## 2016-12-04 ENCOUNTER — Other Ambulatory Visit (HOSPITAL_COMMUNITY)
Admission: RE | Admit: 2016-12-04 | Discharge: 2016-12-04 | Disposition: A | Discharge status: Home / Self Care | Payer: PPO | Source: Ambulatory Visit | Attending: Physician Assistant | Admitting: Physician Assistant

## 2016-12-04 ENCOUNTER — Ambulatory Visit (INDEPENDENT_AMBULATORY_CARE_PROVIDER_SITE_OTHER): Payer: PPO | Admitting: Physician Assistant

## 2016-12-04 ENCOUNTER — Encounter: Payer: Self-pay | Admitting: Physician Assistant

## 2016-12-04 VITALS — BP 120/84 | HR 110 | Temp 98.8°F | Ht 64.0 in | Wt 183.5 lb

## 2016-12-04 DIAGNOSIS — R8290 Unspecified abnormal findings in urine: Secondary | ICD-10-CM | POA: Diagnosis not present

## 2016-12-04 DIAGNOSIS — Z113 Encounter for screening for infections with a predominantly sexual mode of transmission: Secondary | ICD-10-CM | POA: Diagnosis present

## 2016-12-04 LAB — POC URINALSYSI DIPSTICK (AUTOMATED)
Bilirubin, UA: NEGATIVE
Blood, UA: NEGATIVE
GLUCOSE UA: NEGATIVE
Ketones, UA: NEGATIVE
LEUKOCYTES UA: NEGATIVE
NITRITE UA: NEGATIVE
Protein, UA: NEGATIVE
Spec Grav, UA: 1.02
UROBILINOGEN UA: 0.2
pH, UA: 7

## 2016-12-04 NOTE — Patient Instructions (Addendum)
It was great meeting you today!  Please stay hydrated. Follow-up with [place] if your symptoms do not improve.

## 2016-12-04 NOTE — Progress Notes (Signed)
Pre visit review using our clinic review tool, if applicable. No additional management support is needed unless otherwise documented below in the visit note. 

## 2016-12-04 NOTE — Progress Notes (Signed)
Subjective:    Patient ID: Samuel Barker, male    DOB: 08-12-1991, 26 y.o.   MRN: [id number]  HPI  Samuel Barker is a 26 y/o male who presents with odor with urination x 2 days. Denies burning or frequency, denies recent sexual intercourse. Denies bumps, discharge, lesions, fevers, chills, blood from penis. Has never had anything like this before. Samuel Barker states that Samuel Barker drinks a lot of soda and not much water. Denies any unusual back pain or abdominal pain. Denies pain with bowel movements, last BM was today and was soft, not loose.  When asked about any new or unusual foods that Samuel Barker has eaten recently, Samuel Barker tells me that Samuel Barker ate asparagus for the first time two days ago.  Review of Systems  See HPI  No past medical history on file.   Social History   Social History  . Marital status: Single    Spouse name: N/A  . Number of children: N/A  . Years of education: N/A   Occupational History  . Not on file.   Social History Main Topics  . Smoking status: Current Every Day Smoker    Packs/day: 1.00    Types: Cigarettes  . Smokeless tobacco: Never Used  . Alcohol use No  . Drug use: No  . Sexual activity: No   Other Topics Concern  . Not on file   Social History Narrative  . No narrative on file    No past surgical history on file.  Family History  Problem Relation Age of Onset  . Diabetes Mother   . Diabetes Father   . Diabetes Maternal Grandmother   . Diabetes Maternal Grandfather   . Diabetes Paternal Grandmother   . Heart disease Paternal Grandmother   . Diabetes Paternal Grandfather     No Known Allergies  Current Outpatient Prescriptions on File Prior to Visit  Medication Sig Dispense Refill  . hydrocortisone (ANUSOL-HC) 2.5 % rectal cream Place 1 application rectally 2 (two) times daily. 30 g 0   No current facility-administered medications on file prior to visit.     BP 120/84 (BP Location: Left Arm, Patient Position: Sitting, Cuff Size: Normal)   Pulse (!) 110    Temp 98.8 F (37.1 C) (Oral)   Ht 5\' 4"  (1.626 m)   Wt 183 lb 8 oz (83.2 kg)   SpO2 97%   BMI 31.50 kg/m      Objective:   Physical Exam  Constitutional: Samuel Barker appears well-developed and well-nourished. Samuel Barker is cooperative.  Non-toxic appearance. Samuel Barker does not have a sickly appearance. Samuel Barker does not appear ill. No distress.  Cardiovascular: Normal rate, regular rhythm and normal heart sounds.   Pulmonary/Chest: Effort normal and breath sounds normal. No accessory muscle usage. No respiratory distress.  Abdominal: There is no CVA tenderness.  Genitourinary: Testes normal and penis normal. Right testis shows no swelling and no tenderness. Left testis shows no swelling and no tenderness. No penile erythema or penile tenderness. No discharge found.     Genitourinary Comments: CMA in room, chaperoning visit  Neurological: Samuel Barker is alert.  Nursing note and vitals reviewed.  Results for orders placed or performed in visit on 12/04/16  POCT Urinalysis Dipstick (Automated)  Result Value Ref Range   Color, UA Yellow    Clarity, UA Clear    Glucose, UA Negative    Bilirubin, UA Negative    Ketones, UA Negative    Spec Grav, UA 1.020    Blood, UA Negative  pH, UA 7.0    Protein, UA Negative    Urobilinogen, UA 0.2    Nitrite, UA Negative    Leukocytes, UA Negative Negative        Assessment & Plan:  1. Abnormal urine odor POCT UA dipstick unremarkable. Exam benign. Will send urine for GC/chlamydia and perform urine culture. We discussed pushing fluids and maintaining adequate hydration. I advised holding avoid asparagus to see if that changes the smell of his urine, as I suspect this is what is causing his symptoms. I told him to follow-up if symptoms persist or new symptoms develop. Provided reassurance and discussed that we will call with the results of his tests once they are back. Patient agreeable to plan. I also reviewed safe sex practices and encouraged condom use.  Inda Coke  PA-C 12/04/16

## 2016-12-05 LAB — URINE CULTURE: Organism ID, Bacteria: NO GROWTH

## 2016-12-06 LAB — URINE CYTOLOGY ANCILLARY ONLY
Chlamydia: NEGATIVE
Neisseria Gonorrhea: NEGATIVE

## 2016-12-13 ENCOUNTER — Ambulatory Visit (INDEPENDENT_AMBULATORY_CARE_PROVIDER_SITE_OTHER): Payer: PPO

## 2016-12-13 ENCOUNTER — Encounter: Payer: Self-pay | Admitting: Physician Assistant

## 2016-12-13 ENCOUNTER — Ambulatory Visit (INDEPENDENT_AMBULATORY_CARE_PROVIDER_SITE_OTHER): Payer: PPO | Admitting: Physician Assistant

## 2016-12-13 VITALS — BP 118/80 | HR 88 | Temp 98.9°F | Ht 64.0 in | Wt 182.0 lb

## 2016-12-13 DIAGNOSIS — H60501 Unspecified acute noninfective otitis externa, right ear: Secondary | ICD-10-CM

## 2016-12-13 DIAGNOSIS — M25511 Pain in right shoulder: Secondary | ICD-10-CM

## 2016-12-13 DIAGNOSIS — G8929 Other chronic pain: Secondary | ICD-10-CM

## 2016-12-13 MED ORDER — OFLOXACIN 0.3 % OT SOLN: OTIC | 0 refills | Status: DC

## 2016-12-13 NOTE — Progress Notes (Signed)
Pre visit review using our clinic review tool, if applicable. No additional management support is needed unless otherwise documented below in the visit note. 

## 2016-12-13 NOTE — Progress Notes (Signed)
Subjective:    Patient ID: Samuel Barker, male    DOB: December 20, 1990, 26 y.o.   MRN: [id number]  HPI  [name] is a 26 y/o male who presents for evaluation of:  1. Right ear pain - after patient took a shower last night, he developed R ear pain. He states that he did not put anything in his ear and didn't think that he got any water in it. It is intermittent. Denies drainage, fevers, chills, URI symptoms. He is a smoker. He has not had any changes in his hearing.  2. Right shoulder pain - patient reports chronic pain x 1 year. States that he sleeps with his R arm bent and above his head, feels the pain especially after waking up. He is unable to change the position that he sleeps in. He also has some mild pain when he was going to the gym and lift weights. Denies any inciting event or prior injury. Has not had this worked up before. Does not take anything for his pain.  Review of Systems  See HPI  No past medical history on file.   Social History   Social History  . Marital status: Single    Spouse name: N/A  . Number of children: N/A  . Years of education: N/A   Occupational History  . Not on file.   Social History Main Topics  . Smoking status: Current Every Day Smoker    Packs/day: 1.00    Types: Cigarettes  . Smokeless tobacco: Never Used  . Alcohol use No  . Drug use: No  . Sexual activity: No   Other Topics Concern  . Not on file   Social History Narrative  . No narrative on file    No past surgical history on file.  Family History  Problem Relation Age of Onset  . Diabetes Mother   . Diabetes Father   . Diabetes Maternal Grandmother   . Diabetes Maternal Grandfather   . Diabetes Paternal Grandmother   . Heart disease Paternal Grandmother   . Diabetes Paternal Grandfather     No Known Allergies  Current Outpatient Prescriptions on File Prior to Visit  Medication Sig Dispense Refill  . hydrocortisone (ANUSOL-HC) 2.5 % rectal cream Place 1 application  rectally 2 (two) times daily. (Patient not taking: Reported on 12/13/2016) 30 g 0   No current facility-administered medications on file prior to visit.     BP 118/80 (BP Location: Left Arm, Patient Position: Sitting, Cuff Size: Normal)   Pulse 88   Temp 98.9 F (37.2 C) (Oral)   Ht 5\' 4"  (1.626 m)   Wt 182 lb (82.6 kg)   SpO2 97%   BMI 31.24 kg/m       Objective:   Physical Exam  Constitutional: Vital signs are normal. He appears well-developed and well-nourished. He is cooperative.  Non-toxic appearance. He does not have a sickly appearance. He does not appear ill. No distress.  HENT:  Head: Normocephalic and atraumatic.  Right Ear: There is tenderness (with movement of auricle). No drainage. No mastoid tenderness. Tympanic membrane is not perforated, not erythematous and not retracted. No middle ear effusion.  Left Ear: Tympanic membrane, external ear and ear canal normal. No mastoid tenderness. Tympanic membrane is not erythematous, not retracted and not bulging.  Nose: Nose normal.  Mouth/Throat: Uvula is midline. No posterior oropharyngeal edema or posterior oropharyngeal erythema.  Mild erythema to R ear canal, no discharge or foreign body  Cardiovascular: Normal rate,  regular rhythm and normal heart sounds.   Pulmonary/Chest: Effort normal and breath sounds normal. No accessory muscle usage. No respiratory distress.  Musculoskeletal:       Right shoulder: Normal. He exhibits normal range of motion, no tenderness, no deformity, no laceration and no pain.  R shoulder exam benign, no abnormal findings  Neurological: He is alert. He has normal strength. No sensory deficit.  Bilateral shoulder strength 5/5; no sensory deficit of upper extremities  Nursing note and vitals reviewed.     Assessment & Plan:  1. Chronic right shoulder pain Chronic R shoulder pain. I suspect that this is from the position that he sleeps in, but given the duration of symptoms will get x-ray. I  recommended follow-up with Dr. Teresa Coombs if symptoms worsen or persist. May take over the counter Tylenol or Ibuprofen prn.  - DG Shoulder Right; Future  2. Acute otitis externa of right ear, unspecified type Ofloxacin drops to R ear. Follow-up with [place] if symptoms persist or worsen.  Inda Coke PA-C 12/13/16

## 2016-12-13 NOTE — Patient Instructions (Signed)
Use the ear drops as prescribed. Let us know if your symptoms do not improve.  We will get an xray of your shoulder and call you with your results. If your symptoms do not improve, please make an appointment with Dr. Teresa Coombs in sports medicine for further evaluation and treatment.

## 2017-04-02 ENCOUNTER — Encounter: Payer: Self-pay | Admitting: Physician Assistant

## 2017-04-02 ENCOUNTER — Ambulatory Visit (INDEPENDENT_AMBULATORY_CARE_PROVIDER_SITE_OTHER): Payer: PPO | Admitting: Physician Assistant

## 2017-04-02 VITALS — BP 120/80 | HR 71 | Temp 98.8°F | Ht 64.0 in | Wt 181.4 lb

## 2017-04-02 DIAGNOSIS — M79605 Pain in left leg: Secondary | ICD-10-CM | POA: Diagnosis not present

## 2017-04-02 NOTE — Progress Notes (Signed)
Samuel Barker is a 26 y.o. male here for evaluation of foot pain  I acted as a Education administrator for [employer], PA-C Anselmo Pickler, LPN  History of Present Illness:   Chief Complaint  Patient presents with  . Leg Pain    Leg Pain   The incident occurred 6 to 12 hours ago. Incident location: [place] on [place] and the exhaust system fell on pt's left lower leg. The injury mechanism was a direct blow. The pain is present in the left leg. The pain is at a severity of 5/10 (hurts to touch lower left leg). The pain is moderate. The pain has been intermittent since onset. He reports no foreign bodies present. The symptoms are aggravated by movement and palpation (hurts at ankle area when he moves his foot). He has tried nothing for the symptoms.   He has no prior injury to this foot/leg. He endorses no pain with walking. Skin did not break and the exhaust system that fell on his leg was not hot/warm, and he did not sustain any burns.  PMHx, SurgHx, SocialHx, Medications, and Allergies were reviewed in the Visit Navigator and updated as appropriate.  Current Medications:   Current Outpatient Prescriptions:  .  hydrocortisone (ANUSOL-HC) 2.5 % rectal cream, Place 1 application rectally 2 (two) times daily. (Patient not taking: Reported on 12/13/2016), Disp: 30 g, Rfl: 0   Review of Systems:   Review of Systems  Constitutional: Negative for chills, fever, malaise/fatigue and weight loss.  Musculoskeletal: Positive for joint pain (L ankle).  Skin: Negative for itching and rash.  Neurological: Negative for loss of consciousness and headaches.  Endo/Heme/Allergies: Does not bruise/bleed easily.  Psychiatric/Behavioral: The patient is not nervous/anxious.     Vitals:   Vitals:   04/02/17 1317  BP: 120/80  Pulse: 71  Temp: 98.8 F (37.1 C)  TempSrc: Oral  SpO2: 97%  Weight: 181 lb 6.1 oz (82.3 kg)  Height: 5\' 4"  (1.626 m)     Body mass index is 31.13 kg/m.  Physical  Exam:   Physical Exam  Constitutional: He appears well-developed. He is cooperative.  Non-toxic appearance. He does not have a sickly appearance. He does not appear ill. No distress.  Cardiovascular: Normal rate, regular rhythm, S1 normal, S2 normal, normal heart sounds, intact distal pulses and normal pulses.   No LE edema  Pulmonary/Chest: Effort normal and breath sounds normal.  Musculoskeletal:  Erythema and tenderness to area proximal to L medial malleolus, no ecchymosis, no swelling. Pulses present in L foot. Sensation intact. No decrease in ROM of L lower extremity.  Neurological: He is alert. He has normal strength. No sensory deficit.  Skin: There is erythema (proximal area to L medial malleolus).  Nursing note and vitals reviewed.    Assessment and Plan:    Bryten was seen today for leg pain.  Diagnoses and all orders for this visit:  Pain of left lower extremity   Patient has improved without any treatment. We discussed resting the area, elevating the area, using intermittent cold compresses with ice, and also taking ibuprofen to help with any additional pain. He has no difficulty walking or moving his extremity. Pulses and sensation intact. I suspect that this will improve with symptomatic care. I advised patient that if his symptoms worsen or do not improve, I want him to return to our office. Patient verbalized understanding.  . Reviewed expectations re: course of current medical issues. . Discussed self-management of symptoms. . Outlined  signs and symptoms indicating need for more acute intervention. . Patient verbalized understanding and all questions were answered. . See orders for this visit as documented in the electronic medical record. . Patient received an After-Visit Summary.  CMA or LPN served as scribe during this visit. History, Physical, and Plan performed by medical provider. Documentation and orders reviewed and attested to.  Inda Coke, PA-C

## 2017-04-02 NOTE — Patient Instructions (Signed)
Apply ice to the area for about 20 minutes at a time, try this about 3-4 times a day.  Keep the area elevated.  Ibuprofen --> 400 mg every 4 to 6 hours as needed for the pain DO NOT TAKE MORE THAN 3000 MG IN ONE DAY  If your pain doesn't improve or worsen, please let us know!

## 2017-04-03 ENCOUNTER — Encounter: Payer: Self-pay | Admitting: *Deleted

## 2017-05-02 ENCOUNTER — Ambulatory Visit (INDEPENDENT_AMBULATORY_CARE_PROVIDER_SITE_OTHER): Payer: PPO | Admitting: Physician Assistant

## 2017-05-02 ENCOUNTER — Encounter: Payer: Self-pay | Admitting: Physician Assistant

## 2017-05-02 ENCOUNTER — Ambulatory Visit (INDEPENDENT_AMBULATORY_CARE_PROVIDER_SITE_OTHER): Payer: PPO

## 2017-05-02 ENCOUNTER — Ambulatory Visit: Payer: PPO | Admitting: Physician Assistant

## 2017-05-02 VITALS — BP 120/80 | HR 68 | Temp 99.2°F | Ht 64.0 in | Wt 185.4 lb

## 2017-05-02 DIAGNOSIS — R229 Localized swelling, mass and lump, unspecified: Secondary | ICD-10-CM | POA: Diagnosis not present

## 2017-05-02 DIAGNOSIS — B079 Viral wart, unspecified: Secondary | ICD-10-CM | POA: Diagnosis not present

## 2017-05-02 DIAGNOSIS — M79605 Pain in left leg: Secondary | ICD-10-CM

## 2017-05-02 DIAGNOSIS — D229 Melanocytic nevi, unspecified: Secondary | ICD-10-CM

## 2017-05-02 MED ORDER — DOXYCYCLINE HYCLATE 100 MG PO TABS: 100.0000 mg | ORAL_TABLET | Freq: Two times a day (BID) | ORAL | 0 refills | Status: DC

## 2017-05-02 NOTE — Progress Notes (Signed)
Samuel Barker is a 26 y.o. male here for Left lower Leg pain, injured 4 weeks ago and still having pain.  I acted as a Education administrator for [employer], PA-C Anselmo Pickler, LPN  History of Present Illness:   Chief Complaint  Patient presents with  . Left lower leg pain    Leg Pain   Incident onset: follow up from injury 4 weeks ago to left lower leg. Incident location: [employer]. The injury mechanism was a direct blow (exhaust system fell on patients left leg). The pain is present in the left leg. The quality of the pain is described as aching. The pain is at a severity of 4/10. The pain is mild. The pain has been intermittent since onset. The symptoms are aggravated by movement and palpation. He has tried NSAIDs for the symptoms. The treatment provided moderate relief.  This is the second encounter for this leg pain.   Wart He has a wart on his L index finger. Has been present for several weeks. Would like this frozen off today.  Mole He has a mole on his R shoulder that he has concerns about. Has been present for 2 years. States that he recently watched something on TV that says he could have cancer. He is unable to tell me if the mole has changed in size or shape since it first appeared. He reports that he does use sunscreen.  Nodule He reports that he has had a nodule on his R mid back x 1 week. It is tender to touch. He has not had any discharge from the area, redness or warmth. He first noticed it when he returned from the beach. No exposure to any tick bites or other insect bites that he is aware of.  No past medical history on file.   Social History   Social History  . Marital status: Single    Spouse name: N/A  . Number of children: N/A  . Years of education: N/A   Occupational History  . Not on file.   Social History Main Topics  . Smoking status: Current Every Day Smoker    Packs/day: 1.00    Types: Cigarettes  . Smokeless tobacco: Never Used  . Alcohol use No   . Drug use: No  . Sexual activity: No   Other Topics Concern  . Not on file   Social History Narrative   From [place]    No past surgical history on file.  Family History  Problem Relation Age of Onset  . Diabetes Mother   . Diabetes Father   . Diabetes Maternal Grandmother   . Diabetes Maternal Grandfather   . Diabetes Paternal Grandmother   . Heart disease Paternal Grandmother   . Diabetes Paternal Grandfather     No Known Allergies  Current Medications:   Current Outpatient Prescriptions:  .  ibuprofen (ADVIL) 200 MG tablet, Take 200 mg by mouth every 6 (six) hours as needed., Disp: , Rfl:  .  doxycycline (VIBRA-TABS) 100 MG tablet, Take 1 tablet (100 mg total) by mouth 2 (two) times daily., Disp: 20 tablet, Rfl: 0 .  hydrocortisone (ANUSOL-HC) 2.5 % rectal cream, Place 1 application rectally 2 (two) times daily. (Patient not taking: Reported on 12/13/2016), Disp: 30 g, Rfl: 0   Review of Systems:   Review of Systems  Constitutional: Negative for chills, fever, malaise/fatigue and weight loss.  Respiratory: Negative for cough.   Cardiovascular: Negative for chest pain.  Musculoskeletal: Positive for joint pain.  Vitals:   Vitals:   05/02/17 1517  BP: 120/80  Pulse: 68  Temp: 99.2 F (37.3 C)  TempSrc: Oral  SpO2: 98%  Weight: 185 lb 6.1 oz (84.1 kg)  Height: 5\' 4"  (1.626 m)     Body mass index is 31.82 kg/m.  Physical Exam:   Physical Exam  Constitutional: He appears well-developed. He is cooperative.  Non-toxic appearance. He does not have a sickly appearance. He does not appear ill. No distress.  Cardiovascular: Normal rate, regular rhythm, S1 normal, S2 normal, normal heart sounds and normal pulses.   No LE edema  Pulmonary/Chest: Effort normal and breath sounds normal.  Musculoskeletal:       Back:  Erythema and tenderness to area proximal to L medial malleolus, no ecchymosis, no swelling. Pulses present in L foot. Sensation intact. No  decrease in ROM of L lower extremity. No change from prior exam.  Neurological: He is alert. GCS eye subscore is 4. GCS verbal subscore is 5. GCS motor subscore is 6.  Skin: Skin is warm, dry and intact.  14mm verrucous lesion to L index finger DIP joint on dorsum of hand; oblong raised nevus to anterior R shoulder  Psychiatric: He has a normal mood and affect. His speech is normal and behavior is normal.  Nursing note and vitals reviewed.  Consent: Risks and benefits of therapy discussed with patient who voices understanding and agrees with planned care. No barriers to communication or understanding identified. After obtaining informed consent, the patient's identity, procedure, and site were verified during a pause prior to proceeding with the minor surgical procedure as per universal protocol recommendations. After appropriate cleansing, histofreeze was applied to wart on L index finger.   Education: Aftercare, including blister formation, risks of bleeding, and risks of recurrence were discussed. All questions answered.    Assessment and Plan:    Teandre was seen today for left lower leg pain.  Diagnoses and all orders for this visit:  Pain of left lower extremity Exam is benign today. Is able to bear weight without significant issue. Pain is overall improved, however will obtain x-ray given chronicity. I advised patient to continue treatment with OTC ibuprofen prn. We will contact him with xray results and plan of care. -     DG Tibia/Fibula Left; Future  Viral warts, unspecified type Histofreeze applied to 1 lesion today. Follow-up in 2 weeks for re-freeze if desired.  Skin nodule Appears cystic. Will treat with doxycycline and I will have patient follow-up in 2 weeks for re-evaluation, sooner if worsening concerns.  Skin mole Recommended dermatology evaluation. Provided list of recommendations.   Other orders -     doxycycline (VIBRA-TABS) 100 MG tablet; Take 1 tablet (100  mg total) by mouth 2 (two) times daily.    . Reviewed expectations re: course of current medical issues. . Discussed self-management of symptoms. . Outlined signs and symptoms indicating need for more acute intervention. . Patient verbalized understanding and all questions were answered. . See orders for this visit as documented in the electronic medical record. . Patient received an After-Visit Summary.  CMA or LPN served as scribe during this visit. History, Physical, and Plan performed by medical provider. Documentation and orders reviewed and attested to.  Inda Coke, PA-C

## 2017-05-21 ENCOUNTER — Ambulatory Visit: Payer: PPO | Admitting: Physician Assistant

## 2017-05-21 ENCOUNTER — Telehealth: Payer: Self-pay | Admitting: Physician Assistant

## 2017-05-21 DIAGNOSIS — Z0289 Encounter for other administrative examinations: Secondary | ICD-10-CM

## 2017-05-21 NOTE — Telephone Encounter (Signed)
Noted  

## 2017-05-21 NOTE — Telephone Encounter (Signed)
Patient called in with a cough. Patient did not give anymore symptoms. Scheduled patient with Morene Rankins today at [facility].

## 2017-05-21 NOTE — Progress Notes (Deleted)
Samuel Barker is a 26 y.o. male here for Cough  I acted as a Education administrator for [employer], PA-C Anselmo Pickler, LPN  History of Present Illness:   No chief complaint on file.   HPI  No past medical history on file.   Social History   Social History  . Marital status: Single    Spouse name: N/A  . Number of children: N/A  . Years of education: N/A   Occupational History  . Not on file.   Social History Main Topics  . Smoking status: Current Every Day Smoker    Packs/day: 1.00    Types: Cigarettes  . Smokeless tobacco: Never Used  . Alcohol use No  . Drug use: No  . Sexual activity: No   Other Topics Concern  . Not on file   Social History Narrative   From [place]    No past surgical history on file.  Family History  Problem Relation Age of Onset  . Diabetes Mother   . Diabetes Father   . Diabetes Maternal Grandmother   . Diabetes Maternal Grandfather   . Diabetes Paternal Grandmother   . Heart disease Paternal Grandmother   . Diabetes Paternal Grandfather     No Known Allergies  Current Medications:   Current Outpatient Prescriptions:  .  doxycycline (VIBRA-TABS) 100 MG tablet, Take 1 tablet (100 mg total) by mouth 2 (two) times daily., Disp: 20 tablet, Rfl: 0 .  hydrocortisone (ANUSOL-HC) 2.5 % rectal cream, Place 1 application rectally 2 (two) times daily. (Patient not taking: Reported on 12/13/2016), Disp: 30 g, Rfl: 0 .  ibuprofen (ADVIL) 200 MG tablet, Take 200 mg by mouth every 6 (six) hours as needed., Disp: , Rfl:    Review of Systems:   ROS  Vitals:   There were no vitals filed for this visit.   There is no height or weight on file to calculate BMI.  Physical Exam:   Physical Exam  Assessment and Plan:    There are no diagnoses linked to this encounter.  . Reviewed expectations re: course of current medical issues. . Discussed self-management of symptoms. . Outlined signs and symptoms indicating need for more acute  intervention. . Patient verbalized understanding and all questions were answered. . See orders for this visit as documented in the electronic medical record. . Patient received an After-Visit Summary.  CMA or LPN served as scribe during this visit. History, Physical, and Plan performed by medical provider. Documentation and orders reviewed and attested to.  Inda Coke, PA-C

## 2017-05-28 ENCOUNTER — Telehealth: Payer: Self-pay | Admitting: Physician Assistant

## 2017-05-28 ENCOUNTER — Encounter: Payer: Self-pay | Admitting: Physician Assistant

## 2017-05-28 ENCOUNTER — Ambulatory Visit (INDEPENDENT_AMBULATORY_CARE_PROVIDER_SITE_OTHER): Payer: PPO | Admitting: Physician Assistant

## 2017-05-28 VITALS — BP 136/80 | HR 98 | Temp 99.0°F | Ht 64.0 in | Wt 184.4 lb

## 2017-05-28 DIAGNOSIS — H6690 Otitis media, unspecified, unspecified ear: Secondary | ICD-10-CM | POA: Diagnosis not present

## 2017-05-28 DIAGNOSIS — H00015 Hordeolum externum left lower eyelid: Secondary | ICD-10-CM

## 2017-05-28 DIAGNOSIS — J069 Acute upper respiratory infection, unspecified: Secondary | ICD-10-CM | POA: Diagnosis not present

## 2017-05-28 MED ORDER — ERYTHROMYCIN 5 MG/GM OP OINT: TOPICAL_OINTMENT | OPHTHALMIC | 0 refills | Status: DC

## 2017-05-28 MED ORDER — AMOXICILLIN 875 MG PO TABS: 875.0000 mg | ORAL_TABLET | Freq: Two times a day (BID) | ORAL | 0 refills | Status: DC

## 2017-05-28 MED ORDER — POLYMYXIN B-TRIMETHOPRIM 10000-0.1 UNIT/ML-% OP SOLN: OPHTHALMIC | 0 refills | Status: DC

## 2017-05-28 NOTE — Telephone Encounter (Signed)
Please see message and advise 

## 2017-05-28 NOTE — Patient Instructions (Addendum)
    Drink more water daily.  Start Amoxicillin for your ear infection.  Start eye drops for your stye.  STOP SMOKING  Let us know if symptoms persist.  Stye A stye is a bump on your eyelid caused by a bacterial infection. A stye can form inside the eyelid (internal stye) or outside the eyelid (external stye). An internal stye may be caused by an infected oil-producing gland inside your eyelid. An external stye may be caused by an infection at the base of your eyelash (hair follicle). Styes are very common. Anyone can get them at any age. They usually occur in just one eye, but you may have more than one in either eye. What are the causes? The infection is almost always caused by bacteria called Staphylococcus aureus. This is a common type of bacteria that lives on your skin. What increases the risk? You may be at higher risk for a stye if you have had one before. You may also be at higher risk if you have:  Diabetes.  Long-term illness.  Long-term eye redness.  A skin condition called seborrhea.  High fat levels in your blood (lipids).  What are the signs or symptoms? Eyelid pain is the most common symptom of a stye. Internal styes are more painful than external styes. Other signs and symptoms may include:  Painful swelling of your eyelid.  A scratchy feeling in your eye.  Tearing and redness of your eye.  Pus draining from the stye.  How is this diagnosed? Your health care provider may be able to diagnose a stye just by examining your eye. The health care provider may also check to make sure:  You do not have a fever or other signs of a more serious infection.  The infection has not spread to other parts of your eye or areas around your eye.  How is this treated? Most styes will clear up in a few days without treatment. In some cases, you may need to use antibiotic drops or ointment to prevent infection. Your health care provider may have to drain the stye surgically  if your stye is:  Large.  Causing a lot of pain.  Interfering with your vision.  This can be done using a thin blade or a needle. Follow these instructions at home:  Take medicines only as directed by your health care provider.  Apply a clean, warm compress to your eye for 10 minutes, 4 times a day.  Do not wear contact lenses or eye makeup until your stye has healed.  Do not try to pop or drain the stye. Contact a health care provider if:  You have chills or a fever.  Your stye does not go away after several days.  Your stye affects your vision.  Your eyeball becomes swollen, red, or painful. This information is not intended to replace advice given to you by your health care provider. Make sure you discuss any questions you have with your health care provider. Document Released: 07/11/2005 Document Revised: 05/27/2016 Document Reviewed: 01/15/2014 Elsevier Interactive Patient Education  [employer].

## 2017-05-28 NOTE — Telephone Encounter (Signed)
Pharmacy called in reference to Rx for trimethoprim-polymyxin b (POLYTRIM) ophthalmic solution being on back order. Pharmacy wants to know if something else needs to be called in instead. Please call pharmacy and advise.

## 2017-05-28 NOTE — Telephone Encounter (Signed)
I have sent in erythromycin ointment instead -- please let them know they can discontinue the polytrim.  Thanks!  Inda Coke PA-C, 05/28/17

## 2017-05-28 NOTE — Telephone Encounter (Signed)
Called Samuel Barker and spoke to Samuel Barker, told her Samuel Barker sent in new Rx for Erythromycin ointment and to discontinue Polytrim. Samuel Barker verbalized understanding.

## 2017-05-28 NOTE — Progress Notes (Signed)
Samuel Barker is a 26 y.o. male here for a cough.  I acted as a Education administrator for [employer], PA-C Anselmo Pickler, LPN  History of Present Illness:   Chief Complaint  Patient presents with  . Cough    Cough  This is a new problem. Episode onset: x 1.5 weeks. The problem has been gradually worsening. The problem occurs every few hours. The cough is productive of sputum [place] / yellow sputum). Pertinent negatives include no chills, fever, headaches, sore throat or shortness of breath. The symptoms are aggravated by lying down. Treatments tried: Ginger  and honey. The treatment provided mild relief.    Eye Pain L eye has a small bump on the lower lid. Was painful at first but now it isn't. No changes in vision. Does not wear contacts. Denies any discharge to eyes.    Past Medical History:  Diagnosis Date  . Tobacco abuse      Social History   Social History  . Marital status: Single    Spouse name: N/A  . Number of children: N/A  . Years of education: N/A   Occupational History  . Not on file.   Social History Main Topics  . Smoking status: Current Every Day Smoker    Packs/day: 1.00    Types: Cigarettes  . Smokeless tobacco: Never Used  . Alcohol use No  . Drug use: No  . Sexual activity: No   Other Topics Concern  . Not on file   Social History Narrative   From [place]    No past surgical history on file.  Family History  Problem Relation Age of Onset  . Diabetes Mother   . Diabetes Father   . Diabetes Maternal Grandmother   . Diabetes Maternal Grandfather   . Diabetes Paternal Grandmother   . Heart disease Paternal Grandmother   . Diabetes Paternal Grandfather     No Known Allergies  Current Medications:   Current Outpatient Prescriptions:  .  ibuprofen (ADVIL) 200 MG tablet, Take 200 mg by mouth every 6 (six) hours as needed., Disp: , Rfl:  .  amoxicillin (AMOXIL) 875 MG tablet, Take 1 tablet (875 mg total) by mouth 2 (two) times daily.,  Disp: 20 tablet, Rfl: 0 .  hydrocortisone (ANUSOL-HC) 2.5 % rectal cream, Place 1 application rectally 2 (two) times daily. (Patient not taking: Reported on 12/13/2016), Disp: 30 g, Rfl: 0 .  trimethoprim-polymyxin b (POLYTRIM) ophthalmic solution, Apply 1 drop to the affected eye(s) every 3-4 hours x 7 days.  Do not exceed 6 doses/day, Disp: 10 mL, Rfl: 0   Review of Systems:   Review of Systems  Constitutional: Negative for chills and fever.  HENT: Negative for sore throat.   Respiratory: Positive for cough. Negative for shortness of breath.   Neurological: Negative for headaches.    Vitals:   Vitals:   05/28/17 1506  BP: 136/80  Pulse: 98  Temp: 99 F (37.2 C)  TempSrc: Oral  SpO2: 96%  Weight: 184 lb 6.1 oz (83.6 kg)  Height: 5\' 4"  (1.626 m)     Body mass index is 31.65 kg/m.  Physical Exam:   Physical Exam  Constitutional: He appears well-developed. He is cooperative.  Non-toxic appearance. He does not have a sickly appearance. He does not appear ill. No distress.  HENT:  Head: Normocephalic and atraumatic.  Right Ear: External ear and ear canal normal. Tympanic membrane is erythematous. Tympanic membrane is not retracted and not bulging.  Left Ear:  Tympanic membrane, external ear and ear canal normal. Tympanic membrane is not erythematous, not retracted and not bulging.  Nose: Nose normal. Right sinus exhibits no maxillary sinus tenderness and no frontal sinus tenderness. Left sinus exhibits no maxillary sinus tenderness and no frontal sinus tenderness.  Mouth/Throat: Uvula is midline. No posterior oropharyngeal edema or posterior oropharyngeal erythema.  Eyes: Pupils are equal, round, and reactive to light. Conjunctivae, EOM and lids are normal. Left eye exhibits hordeolum. Right conjunctiva is not injected. Right conjunctiva has no hemorrhage. Left conjunctiva is not injected. Left conjunctiva has no hemorrhage.    Neck: Trachea normal.  Cardiovascular: Normal rate,  regular rhythm, S1 normal, S2 normal, normal heart sounds and normal pulses.   No LE edema  Pulmonary/Chest: Effort normal and breath sounds normal. He has no decreased breath sounds. He has no wheezes. He has no rhonchi. He has no rales.  Lymphadenopathy:    He has no cervical adenopathy.  Neurological: He is alert. GCS eye subscore is 4. GCS verbal subscore is 5. GCS motor subscore is 6.  Skin: Skin is warm, dry and intact.  Psychiatric: He has a normal mood and affect. His speech is normal and behavior is normal.  Nursing note and vitals reviewed.    Assessment and Plan:    Khale was seen today for cough.  Diagnoses and all orders for this visit:  Upper respiratory tract infection, unspecified type Suspect viral. Discussed using Mucinex BID and Delsym BID, drink plenty of fluids and rest. Tylenol or ibuprofen for any pain/fevers. Follow-up if cough worsens or symptoms persist despite treatment  Acute otitis media, unspecified otitis media type Amoxicillin per orders. Early otitis media on R side. Follow-up if symptoms worsen or persist.  Hordeolum externum left lower eyelid Warm compresses and polytrim eye drops. Provided handout of red flags. ollow-up if symptoms worsen or persist.  Other orders -     amoxicillin (AMOXIL) 875 MG tablet; Take 1 tablet (875 mg total) by mouth 2 (two) times daily. -     trimethoprim-polymyxin b (POLYTRIM) ophthalmic solution; Apply 1 drop to the affected eye(s) every 3-4 hours x 7 days.  Do not exceed 6 doses/day    . Reviewed expectations re: course of current medical issues. . Discussed self-management of symptoms. . Outlined signs and symptoms indicating need for more acute intervention. . Patient verbalized understanding and all questions were answered. . See orders for this visit as documented in the electronic medical record. . Patient received an After-Visit Summary.  CMA or LPN served as scribe during this visit. History, Physical, and  Plan performed by medical provider. Documentation and orders reviewed and attested to.  Inda Coke, PA-C

## 2017-06-14 ENCOUNTER — Ambulatory Visit: Payer: PPO | Admitting: Family Medicine

## 2017-06-18 ENCOUNTER — Encounter: Payer: Self-pay | Admitting: Family Medicine

## 2017-06-18 ENCOUNTER — Telehealth: Payer: Self-pay | Admitting: Physician Assistant

## 2017-06-18 ENCOUNTER — Ambulatory Visit (INDEPENDENT_AMBULATORY_CARE_PROVIDER_SITE_OTHER): Payer: PPO | Admitting: Family Medicine

## 2017-06-18 VITALS — BP 128/74 | HR 78 | Temp 98.6°F | Ht 64.0 in | Wt 183.2 lb

## 2017-06-18 DIAGNOSIS — N644 Mastodynia: Secondary | ICD-10-CM

## 2017-06-18 DIAGNOSIS — W57XXXA Bitten or stung by nonvenomous insect and other nonvenomous arthropods, initial encounter: Secondary | ICD-10-CM | POA: Diagnosis not present

## 2017-06-18 DIAGNOSIS — S20369A Insect bite (nonvenomous) of unspecified front wall of thorax, initial encounter: Secondary | ICD-10-CM

## 2017-06-18 DIAGNOSIS — L989 Disorder of the skin and subcutaneous tissue, unspecified: Secondary | ICD-10-CM

## 2017-06-18 MED ORDER — DOXYCYCLINE HYCLATE 100 MG PO TABS: 100.0000 mg | ORAL_TABLET | Freq: Two times a day (BID) | ORAL | 0 refills | Status: DC

## 2017-06-18 NOTE — Telephone Encounter (Signed)
Pt is scheduled with Dr. Juleen China today.

## 2017-06-18 NOTE — Telephone Encounter (Signed)
°  Patient Name: Samuel Barker  DOB: [birth year]    Initial Comment Caller states that he is having chest pain x 3 days   Nurse Assessment  Nurse: Julien Girt, RN, Almyra Free Date/Time Eilene Ghazi Time): 06/18/2017 12:25:57 PM  Confirm and document reason for call. If symptomatic, describe symptoms. ---Caller states that he has been having right sided chest pain for the last 3-4 days. Denies injury, he has an area that is tender to touch. No bruising or sob. He is currently driving.  Does the patient have any new or worsening symptoms? ---Yes  Will a triage be completed? ---Yes  Related visit to physician within the last 2 weeks? ---No  Does the PT have any chronic conditions? (i.e. diabetes, asthma, etc.) ---No  Is this a behavioral health or substance abuse call? ---No     Guidelines    Guideline Title Affirmed Question Affirmed Notes  Chest Pain [1] Chest pain(s) lasting a few seconds AND [2] persists > 3 days    Final Disposition User   See PCP When Office is Open (within 3 days) Julien Girt, RN, Almyra Free    Referrals  REFERRED TO PCP OFFICE   Disagree/Comply: [facility]

## 2017-06-18 NOTE — Progress Notes (Signed)
Samuel Barker is a 26 y.o. male here for an acute visit.  History of Present Illness:   Water quality scientist, CMA, acting as scribe for Dr. Juleen China.  HPI: Patient states he has tenderness over the nipple area on the right side of his chest.  This has been going on for about 4 days.  Only has pain when touching the area.  Doesn't hurt when moving or just sitting still.  No pain with deep breathing.  Slight swelling.  No drainage from his nipple.  Patient does not smoke marijuana.  Upon palpation, a cyst-like mass is felt in the left breast area.  Also states that about 10 days ago he found a tick on his abdomen.  He removed it with a knife.  He says the area is now very itchy.    PMHx, SurgHx, SocialHx, Medications, and Allergies were reviewed in the Visit Navigator and updated as appropriate.  Current Medications:   .  ibuprofen (ADVIL) 200 MG tablet, Take 200 mg by mouth every 6 (six) hours as needed., Disp: , Rfl:    No Known Allergies   Review of Systems:   Pertinent items are noted in the HPI. Otherwise, ROS is negative.  Vitals:   Vitals:   06/18/17 1452  BP: 128/74  Pulse: 78  Temp: 98.6 F (37 C)  TempSrc: Oral  SpO2: 97%  Weight: 183 lb 3.2 oz (83.1 kg)  Height: 5\' 4"  (1.626 m)     Body mass index is 31.45 kg/m.   Physical Exam:   Physical Exam  Constitutional: He is oriented to person, place, and time. He appears well-developed and well-nourished. No distress.  HENT:  Head: Normocephalic and atraumatic.  Right Ear: External ear normal.  Left Ear: External ear normal.  Nose: Nose normal.  Mouth/Throat: Oropharynx is clear and moist.  Eyes: Pupils are equal, round, and reactive to light. Conjunctivae and EOM are normal.  Neck: Normal range of motion. Neck supple.  Cardiovascular: Normal rate, regular rhythm, normal heart sounds and intact distal pulses.   Pulmonary/Chest: Effort normal and breath sounds normal. Right breast exhibits mass and tenderness. Right  breast exhibits no inverted nipple, no nipple discharge and no skin change.    Smooth, fullness palpated, ttp.  Abdominal: Soft. Bowel sounds are normal.  Musculoskeletal: Normal range of motion.  Neurological: He is alert and oriented to person, place, and time.  Skin: Skin is warm and dry.  Psychiatric: He has a normal mood and affect. His behavior is normal. Judgment and thought content normal.  Nursing note and vitals reviewed.   Assessment and Plan:   [name] was seen today for acute visit.  Diagnoses and all orders for this visit:  Tick bite of chest wall, initial encounter Comments: Antibiotic as below.  Orders: -     doxycycline (VIBRA-TABS) 100 MG tablet; Take 1 tablet (100 mg total) by mouth 2 (two) times daily.  Breast tenderness in male Comments: To Dr. Paulla Fore for [place] evaluation if not improving in one week. Orders: -     AMB referral to sports medicine  Skin lesion Comments: Possible dermatofibroma. Schedule punch biopsy with PCP.    Marland Kitchen Reviewed expectations re: course of current medical issues. . Discussed self-management of symptoms. . Outlined signs and symptoms indicating need for more acute intervention. . Patient verbalized understanding and all questions were answered. Marland Kitchen Health Maintenance issues including appropriate healthy diet, exercise, and smoking avoidance were discussed with patient. . See orders for this visit as  documented in the electronic medical record. . Patient received an After Visit Summary.  CMA served as Education administrator during this visit. History, Physical, and Plan performed by medical provider. The above documentation has been reviewed and is accurate and complete. Briscoe Deutscher, D.O.  Briscoe Deutscher, DO , [facility] 06/25/2017

## 2017-06-18 NOTE — Telephone Encounter (Signed)
Patient called w/  chest pain, transferred to [facility]  -LL

## 2017-06-19 ENCOUNTER — Ambulatory Visit: Payer: PPO | Admitting: Physician Assistant

## 2017-07-03 ENCOUNTER — Ambulatory Visit: Payer: PPO | Admitting: Sports Medicine

## 2017-07-03 ENCOUNTER — Ambulatory Visit (INDEPENDENT_AMBULATORY_CARE_PROVIDER_SITE_OTHER): Payer: PPO | Admitting: Sports Medicine

## 2017-07-03 ENCOUNTER — Ambulatory Visit: Payer: Self-pay

## 2017-07-03 ENCOUNTER — Encounter: Payer: Self-pay | Admitting: Sports Medicine

## 2017-07-03 VITALS — BP 116/78 | HR 95 | Ht 64.0 in | Wt 182.0 lb

## 2017-07-03 DIAGNOSIS — G8929 Other chronic pain: Secondary | ICD-10-CM

## 2017-07-03 DIAGNOSIS — M75101 Unspecified rotator cuff tear or rupture of right shoulder, not specified as traumatic: Secondary | ICD-10-CM

## 2017-07-03 DIAGNOSIS — M25511 Pain in right shoulder: Secondary | ICD-10-CM

## 2017-07-03 DIAGNOSIS — R229 Localized swelling, mass and lump, unspecified: Secondary | ICD-10-CM

## 2017-07-03 DIAGNOSIS — N644 Mastodynia: Secondary | ICD-10-CM | POA: Diagnosis not present

## 2017-07-03 MED ORDER — NITROGLYCERIN 0.2 MG/HR TD PT24: MEDICATED_PATCH | TRANSDERMAL | 1 refills | Status: DC

## 2017-07-03 NOTE — Progress Notes (Signed)
LATE ENTRY NOTE  OFFICE VISIT NOTE Samuel Barker. [place], [place] [facility] [place] - 26 y.o. male MRN [id number]  Date of birth: 1991-09-22  Visit Date: 07/03/2017  PCP: Inda Coke, PA   Referred by: Inda Coke, PA  [place], [place] acting as scribe for Dr. Paulla Fore.  SUBJECTIVE:   Chief Complaint  Patient presents with  . New Patient (Initial Visit)    cyst-like mass on RT side of chest   HPI: As below and per problem based documentation when appropriate.  [name] is a new patient, referred by Dr. Juleen China, presenting today for evaluation of cyst-like mass on the RT side of the chest.  This was first noticed around 06/15/2017. No know injury or trauma.  Worsened with palpation Improves with rest Pt denies discharge/drainage from the nipple, pain with deep breaths.   Pt reports that he was referred for RT shoulder pain. He has a small mass on the back of his RT shoulder. He has been seen by ortho in the past for this issue has got steroid injection. Pain seems to be mostly on the posterior aspect of the shoulder. He recalls injuring his shoulder a very long time ago. He also sleeps with his RT arm under his head. Pain is described as aching and tightness. He isn't currently experiencing pain but at times it can rate 10/10. Pain is worse when raising arm overhead and when lifting heavy objects. He has not tried taking any OTC meds for the pain. He has tried uses ice on the shoulder with some temporary relief. Pain does not radiate into the back, neck or arm. He denies weakness in the RT arm.     Review of Systems  Constitutional: Negative for chills and fever.  Respiratory: Negative for shortness of breath and wheezing.   Cardiovascular: Negative for chest pain and palpitations.  Musculoskeletal: Positive for joint pain. Negative for falls.  Neurological: Positive for dizziness, tingling and  headaches.  Endo/Heme/Allergies: Does not bruise/bleed easily.    Otherwise per HPI.    HISTORY & PERTINENT PRIOR DATA:  Prior History reviewed and updated per electronic medical record.  Significant history, findings, studies and interim changes include:  reports that he has been smoking cigarettes.  He has been smoking about 1.00 pack per day. he has never used smokeless tobacco. No results for input(s): HGBA1C, LABURIC, CREATINE in the last 8760 hours. No specialty comments available. Problem  Chronic Right Shoulder Pain    OBJECTIVE:  VS:  HT:5\' 4"  (162.6 cm)   WT:182 lb (82.6 kg)  BMI:31.22    BP:116/78  HR:95bpm  TEMP: ( )  RESP:97 %  PHYSICAL EXAM: Constitutional: WDWN, Non-toxic appearing. Psychiatric: Alert & appropriately interactive. Not depressed or anxious appearing. Respiratory: No increased work of breathing. Trachea Midline Eyes: Pupils are equal. EOM intact without nystagmus. No scleral icterus  UPPER EXTREMITIES No clubbing or cyanosis appreciated Capillary Refill is normal, less than 2 seconds No signficant upper extremity generalized edema Radial Pulses: Normal and symmetrically palpable Sensation in UE dermatomes: intact to light touch   Right Shoulder Exam: Normal alignment & Contours Skin: No overlying erythema/ecchymosis -is a small palpable area along the posterior aspect of the shoulder but this is minimal and nonpainful.  No surrounding erythema.  No fluctuance. Neck: normal range of motion and supple Axial loading and circumduction produces: No pain or crepitation  Non tender over: Bony Landmarks TTP over:  Anterior aspect of shoulder but this is minimal.  Drop arm test: negative Hawkins: positive, mild pain  Neers: positive, mild pain   Internal Rotation: full range without pain. Strength: normal External Rotation: full range without pain. Strength: 4+  Empty can: positive, mild pain Strength: 4+ Speeds: normal, no pain Strength:  normal O'Briens: positive, mild pain Strength: 4+  ++++++++++++++++++++++++++++++++++++++++++++  Chest wall: No overt palpable masses.  Small amount of gynecomastia without nipple discharge.  Nontender.  +++++++++++++++++++++++++++++++++++++++++++++ LIMITED MSK ULTRASOUND OF right shoulder Images were obtained and interpreted by myself, Teresa Coombs, DO  Images have been saved and stored to PACS system. Images obtained on: GE S7 Ultrasound machine  FINDINGS:  Biceps Tendon: Normal Pec Major Insertion: Normal Subscapularis Tendon: Diminutive given overlying deltoid size but normal appearing.  No significant internal impingement Supraspinatus Tendon: Interstitial splitting appreciated with a small amount of interstitial swelling and pain.  No significant retraction. Infraspinatus/Teres Minor Tendon: Normal AC Joint: Normal JOINT: No significant GH spurring appreciated  LABRUM: No overt tearing appreciated but limited exam   Posterior shoulder does have a small subcutaneous nodule that is epithelial in nature and consistent with an epithelial inclusion body   IMPRESSION:  1. Supraspinatus tendinopathy with interstitial splitting 2. Posterior shoulder skin inclusion body    ASSESSMENT & PLAN:   1. Breast tenderness in male   2. Skin nodule   3. Chronic right shoulder pain   4. Rotator cuff syndrome of right shoulder    PLAN: Overall reassurance provided for the skin nodule and breast tenderness.  This has essentially resolved this time from a pain standpoint and the nodule is not worrisome.  If any worsening symptoms he will follow-up for this.  Chronic right shoulder pain Symptoms are consistent with supraspinatus tendinopathy and scapular dyskinesis. Therapeutic exercises provided per AVS Discussed nitroglycerin protocol Follow-up in 6 weeks for repeat exam and to ensure clinical improvement.   ++++++++++++++++++++++++++++++++++++++++++++ Orders & Meds: Orders Placed  This Encounter  Procedures  . [place] LIMITED JOINT SPACE STRUCTURES UP BILAT(NO LINKED CHARGES)    Meds ordered this encounter  Medications  . nitroGLYCERIN (NITRODUR - DOSED IN MG/24 HR) 0.2 mg/hr patch    Sig: Place 1/4 to 1/2 of a patch over affected region. Remove and replace once daily.  Slightly alter skin placement daily    Dispense:  30 patch    Refill:  1    For musculoskeletal purposes.  Okay to cut patch.    ++++++++++++++++++++++++++++++++++++++++++++ Follow-up: Return in about 6 weeks (around 08/14/2017).   Pertinent documentation may be included in additional procedure notes, imaging studies, problem based documentation and patient instructions. Please see these sections of the encounter for additional information regarding this visit. CMA/ATC served as Education administrator during this visit. History, Physical, and Plan performed by medical provider. Documentation and orders reviewed and attested to.      Gerda Diss, [facility] Physician

## 2017-07-03 NOTE — Patient Instructions (Addendum)
Nitroglycerin Protocol   Apply 1/4 nitroglycerin patch to affected area daily.  Change position of patch within the affected area every 24 hours.  You may experience a headache during the first 1-2 weeks of using the patch, these should subside.  If you experience headaches after beginning nitroglycerin patch treatment, you may take your preferred over the counter pain reliever.  Another side effect of the nitroglycerin patch is skin irritatiosn or rash related to patch adhesive.  Please notify our office if you develop more severe headaches or rash, and stop the patch.  Tendon healing with nitroglycerin patch may require 12 to 24 weeks depending on the extent of injury.  Men should not use if taking Viagra, Cialis, or Levitra.   Do not use if you have migraines or rosacea.

## 2017-08-14 ENCOUNTER — Encounter: Payer: Self-pay | Admitting: Sports Medicine

## 2017-08-14 ENCOUNTER — Ambulatory Visit (INDEPENDENT_AMBULATORY_CARE_PROVIDER_SITE_OTHER): Payer: PPO | Admitting: Sports Medicine

## 2017-08-14 VITALS — BP 118/76 | HR 80 | Ht 64.0 in | Wt 177.4 lb

## 2017-08-14 DIAGNOSIS — M25511 Pain in right shoulder: Secondary | ICD-10-CM

## 2017-08-14 DIAGNOSIS — G8929 Other chronic pain: Secondary | ICD-10-CM

## 2017-08-14 NOTE — Procedures (Signed)
PROCEDURE NOTE: THERAPEUTIC EXERCISES (97110) 15 minutes spent for Therapeutic exercises as below and as referenced in the AVS. This included exercises focusing on stretching, strengthening, with significant focus on eccentric aspects.  Proper technique shown and discussed handout in great detail with ATC. All questions were discussed and answered.   Long term goals include an improvement in range of motion, strength, endurance as well as avoiding reinjury. Frequency of visits is one time as determined during today's  office visit. Frequency of exercises to be performed is as per handout.  EXERCISES REVIEWED:  Scap stab  Intrinsic rotator cuff

## 2017-08-14 NOTE — Assessment & Plan Note (Signed)
Small interstitial supra spinatus tear on last [place] Continues to do heavy overhead activites in gym.  Recommend avoiding overhead and any other painful movement HEP emphasized again today and reviewed per procedure note.  Offered MR Arthrogram but given lack of mechanical symptoms non urgent and he would like to defer.

## 2017-08-14 NOTE — Progress Notes (Signed)
OFFICE VISIT NOTE Samuel Barker. Samuel Barker, Samuel Barker at Samuel Barker - 26 y.o. male MRN [id number]  Date of birth: Aug 27, 1991  Visit Date: 08/14/2017  PCP: Samuel Coke, PA   Referred by: Samuel Barker, [place]  Samuel Barker PT, LAT, ATC acting as scribe for Dr. Paulla Barker.  SUBJECTIVE:   Chief Complaint  Patient presents with  . Follow-up    R shoulder and chest pain   HPI: As below and per problem based documentation when appropriate.  Mr. Straw is an established pt presenting today for f/u of R shoulder and chest pain.  He was last seen on 07/03/17 and was prescribed nitroglycerin patches.  Pt states that his R shoulder feels about the same overall.  He reports having gotten the nitroglycerin patches and has been using them consistently but not changing them every day.  Pt states that any and all motions cause him R shoulder pain and cannot specifically pinpoint a certain motion.    ROS  Otherwise per HPI.   HISTORY & PERTINENT PRIOR DATA:  Prior History reviewed and updated per electronic medical record.  Significant history, findings, studies and interim changes include:  reports that he has been smoking cigarettes.  He has been smoking about 1.00 pack per day. he has never used smokeless tobacco. No results for input(s): HGBA1C, LABURIC, CREATINE in the last 8760 hours. No specialty comments available. Problem  Chronic Right Shoulder Pain     OBJECTIVE:  VS:  HT:5\' 4"  (162.6 cm)   WT:177 lb 6.4 oz (80.5 kg)  BMI:30.44    BP:118/76  HR:80bpm  TEMP: ( )  RESP:97 %  PHYSICAL EXAM: Constitutional: WDWN, Non-toxic appearing. Psychiatric: Alert & appropriately interactive. Not depressed or anxious appearing. Respiratory: No increased work of breathing. Trachea Midline Eyes: Pupils are equal. EOM intact without nystagmus. No scleral icterus Cardiovascular:  Peripheral Pulses: peripheral pulses  symmetrical No clubbing or cyanosis appreciated Capillary Refill is normal, less than 2 seconds No signficant generalized edema/anasarca Sensory Exam: intact to light touch  Right Shoulder Exam: Normal alignment & Contours Skin: No overlying erythema/ecchymosis Neck: normal range of motion and supple Axial loading and circumduction produces: Mild crepitation without reproducibility.  No significant pain  Non tender to palpation over: Bony Landmarks TTP over: none Drop arm test: negative Hawkins: normal, no pain  Neers: normal, no pain   Internal Rotation:  ROM: Lumbar with no pain Strength: Normal External Rotation:  ROM: Normal with no pain Strength: Normal  Empty can: positive, mild pain Strength: 4/5 Speeds: normal, no pain Strength: 4/5 O'Briens: normal, no pain Strength: Normal   No additional findings.   ASSESSMENT & PLAN:   1. Chronic right shoulder pain    PLAN:    Chronic right shoulder pain Small interstitial supra spinatus tear on last [place] Continues to do heavy overhead activites in gym.  Recommend avoiding overhead and any other painful movement HEP emphasized again today and reviewed per procedure note.  Offered MR Arthrogram but given lack of mechanical symptoms non urgent and he would like to defer.   ++++++++++++++++++++++++++++++++++++++++++++ Orders & Meds: Orders Placed This Encounter  Procedures  . Misc procedure    No orders of the defined types were placed in this encounter.   ++++++++++++++++++++++++++++++++++++++++++++ Follow-up: Return in about 8 weeks (around 10/09/2017).   Pertinent documentation may be included in additional procedure notes, imaging studies, problem based documentation and patient instructions. Please see these sections  of the encounter for additional information regarding this visit. CMA/ATC served as Education administrator during this visit. History, Physical, and Plan performed by medical provider. Documentation and orders reviewed and  attested to.      Samuel Barker, [facility] Physician

## 2017-08-14 NOTE — Patient Instructions (Signed)
Please perform the exercise program that we have prepared for you and gone over in detail on a daily basis.  In addition to the handout you were provided you can access your program through: [email or web address]   Your unique program code is: [email or web address]   Nitroglycerin Protocol   Apply 1/4 nitroglycerin patch to affected area daily.  Change position of patch within the affected area every 24 hours.  You may experience a headache during the first 1-2 weeks of using the patch, these should subside.  If you experience headaches after beginning nitroglycerin patch treatment, you may take your preferred over the counter pain reliever.  Another side effect of the nitroglycerin patch is skin irritation or rash related to patch adhesive.  Please notify our office if you develop more severe headaches or rash, and stop the patch.  Tendon healing with nitroglycerin patch may require 12 to 24 weeks depending on the extent of injury.  Men should not use if taking Viagra, Cialis, or Levitra.   Do not use if you have migraines or rosacea.

## 2017-09-18 ENCOUNTER — Encounter: Payer: Self-pay | Admitting: Sports Medicine

## 2017-09-20 NOTE — Assessment & Plan Note (Signed)
Symptoms are consistent with supraspinatus tendinopathy and scapular dyskinesis. Therapeutic exercises provided per AVS Discussed nitroglycerin protocol Follow-up in 6 weeks for repeat exam and to ensure clinical improvement.

## 2017-10-09 ENCOUNTER — Ambulatory Visit: Payer: PPO | Admitting: Sports Medicine

## 2017-10-10 ENCOUNTER — Encounter: Payer: Self-pay | Admitting: Sports Medicine

## 2017-10-10 ENCOUNTER — Ambulatory Visit (INDEPENDENT_AMBULATORY_CARE_PROVIDER_SITE_OTHER): Payer: PPO | Admitting: Sports Medicine

## 2017-10-10 DIAGNOSIS — M25511 Pain in right shoulder: Secondary | ICD-10-CM

## 2017-10-10 DIAGNOSIS — G8929 Other chronic pain: Secondary | ICD-10-CM | POA: Diagnosis not present

## 2017-10-10 NOTE — Patient Instructions (Signed)

## 2017-10-10 NOTE — Progress Notes (Signed)
Samuel Barker. Anchor Dwan, [place] at [place] - 26 y.o. male MRN [id number]  Date of birth: [birth year]  Visit Date: 10/10/2017  PCP: Inda Coke, PA   Referred by: Inda Coke, [place]   Scribe for today's visit: Wendy Poet, ATC    SUBJECTIVE:  Zeke Aker is here for Follow-up (R shoulder pain) .   Mr. Petronio is an established pt who presented on 08/14/17 for f/u of R shoulder and chest pain.  He was seen previously on 07/03/17 and was prescribed nitroglycerin patches.  Pt states that his R shoulder feels about the same overall.  He reports having gotten the nitroglycerin patches and has been using them consistently but not changing them every day.  Pt states that any and all motions cause him R shoulder pain and cannot specifically pinpoint a certain motion.   Compared to the last office visit on 08/14/17, his previously described R shoulder symptoms show no change. Current symptoms are mild & are radiating to his R upper arm only when he has "strong" pain in his R shoulder. He has been using nitroglycerin patches and doing his HEP approximately 2-3x/week.    ROS Denies night time disturbances. Denies fevers, chills, or night sweats. Denies unexplained weight loss. Denies personal history of cancer. Denies changes in bowel or bladder habits. Denies recent unreported falls. Reports new or worsening dyspnea or wheezing. Reports headaches or dizziness. Headaches and has gotten dizzy frequently over the past 2-3 weeks. Denies numbness, tingling or weakness  In the extremities.  Denies dizziness or presyncopal episodes Denies lower extremity edema     HISTORY & PERTINENT PRIOR DATA:  Prior History reviewed and updated per electronic medical record.  Significant history, findings, studies and interim changes include:  reports that he has been smoking cigarettes.  He has been smoking about 1.00 pack per day.  he has never used smokeless tobacco. No results for input(s): HGBA1C, LABURIC, CREATINE in the last 8760 hours. No specialty comments available. No problems updated.   OBJECTIVE:  VS:  HT:5\' 4"  (162.6 cm)   WT:179 lb 12.8 oz (81.6 kg)  BMI:30.85    BP:108/72  HR:70bpm  TEMP: ( )  RESP:98 %   PHYSICAL EXAM: Constitutional: WDWN, Non-toxic appearing. Psychiatric: Alert & appropriately interactive. Not depressed or anxious appearing. Respiratory: No increased work of breathing. Trachea Midline Eyes: Pupils are equal. EOM intact without nystagmus. No scleral icterus Cardiovascular:  Peripheral Pulses: peripheral pulses symmetrical No clubbing or cyanosis appreciated Capillary Refill is normal, less than 2 seconds No signficant generalized edema/anasarca Sensory Exam: intact to light touch  Right shoulder: Persistent pain with empty can testing.  He has pain with axial load and circumduction that is mild.  Persistent scapular dyskinesis.  Pain is worse with overhead motion.  Pain over the biceps tendon.  Internal rotation and external rotation strength is 5/5   ASSESSMENT & PLAN:   1. Chronic right shoulder pain    PLAN: Symptoms are consistent with continued scapular dyskinesis with underlying rotator cuff tendinosis based on [facility] ultrasound previously performed.  Unfortunately I am concerned that he may have an underlying labral tear and further diagnostic evaluation with MR arthrogram recommended at this time.  We did discuss the other option of potential diagnostic and therapeutic intra-articular injection but he would like to have more definitive information prior to having this performed.  Continue with avoidance of exacerbating activities and continued strengthening of intrinsic rotator  cuff muscles   ++++++++++++++++++++++++++++++++++++++++++++ Orders & Meds: Orders Placed This Encounter  Procedures  . MR SHOULDER RIGHT W CONTRAST  . DG Arthro Shoulder Right    No orders  of the defined types were placed in this encounter.   ++++++++++++++++++++++++++++++++++++++++++++ Follow-up: Return for MRI review.   Pertinent documentation may be included in additional procedure notes, imaging studies, problem based documentation and patient instructions. Please see these sections of the encounter for additional information regarding this visit. CMA/ATC served as Education administrator during this visit. History, Physical, and Plan performed by medical provider. Documentation and orders reviewed and attested to.      Gerda Diss, [facility] Physician

## 2017-10-16 ENCOUNTER — Encounter: Payer: Self-pay | Admitting: Sports Medicine

## 2017-10-18 ENCOUNTER — Telehealth: Payer: Self-pay | Admitting: Physician Assistant

## 2017-10-18 NOTE — Telephone Encounter (Signed)
Copied from [place]. Topic: Inquiry >> Oct 18, 2017  2:12 PM Samuel Barker B wrote: Reason for CRM: pt called b/c his insurance provider is needing reasoning on what the mri is for so in order for them to cover the procedure, contact the insurance provider @ [phone]. Insurance is saying they will cover just under certain stipulations, contact insurance

## 2017-10-18 NOTE — Telephone Encounter (Signed)
Please call patient to advise

## 2017-10-21 NOTE — Telephone Encounter (Signed)
Pt called in to request to speak with provider. Pt says that he spoke with insurance and was advised to have office call [phone] to have MRI covered.

## 2017-10-21 NOTE — Telephone Encounter (Signed)
Called and spoke with patient about MRI. Informed patient that after speaking with [facility] they stated it "should" be covered. I informed patient that [facility] stated as such, and that they informed me we would not ultimately know the final decision, and that it would be up to the patient to ensure it was covered. No further action needed at this time.

## 2017-10-21 NOTE — Telephone Encounter (Signed)
Please contact the number provided to assist patient regarding MRI.

## 2017-10-21 NOTE — Telephone Encounter (Signed)
Called and spoke with [place] about pending MRI.  Per rep, MRI will be sent for review with their clinical team. No authorization will be given to [place], other then the verbal note that it SHOULD be covered 100%.  Rep stated MRI would be reviewed with member for specific details about injury and need for MRI, which would ultimately determine coverage.  We can leave scheduled appointment as needed, but I cannot guarantee it will covered fully per [facility] rep.

## 2017-10-21 NOTE — Telephone Encounter (Signed)
Noted, Dr. Paulla Fore made verbally aware.

## 2017-10-22 NOTE — Telephone Encounter (Signed)
Noted  

## 2017-10-31 ENCOUNTER — Ambulatory Visit
Admission: RE | Admit: 2017-10-31 | Discharge: 2017-10-31 | Disposition: A | Discharge status: Home / Self Care | Payer: PRIVATE HEALTH INSURANCE | Source: Ambulatory Visit | Attending: Sports Medicine | Admitting: Sports Medicine

## 2017-10-31 ENCOUNTER — Other Ambulatory Visit: Payer: Self-pay | Admitting: Sports Medicine

## 2017-10-31 DIAGNOSIS — M25511 Pain in right shoulder: Principal | ICD-10-CM

## 2017-10-31 DIAGNOSIS — G8929 Other chronic pain: Secondary | ICD-10-CM

## 2017-10-31 MED ORDER — IOPAMIDOL (ISOVUE-M 200) INJECTION 41%
15.0000 mL | Freq: Once | INTRAMUSCULAR | Status: AC
  Administered 2017-10-31: 15 mL via INTRA_ARTICULAR

## 2017-11-01 ENCOUNTER — Telehealth: Payer: Self-pay | Admitting: Sports Medicine

## 2017-11-01 NOTE — Telephone Encounter (Signed)
Think that this goes to you.

## 2017-11-01 NOTE — Telephone Encounter (Signed)
Copied from [place] [id number]. Topic: Quick Communication - See Telephone Encounter >> Nov 01, 2017 12:35 PM Bea Graff, NT wrote: CRM for notification. See Telephone encounter for: Pt would like results from his MRI  11/01/17.

## 2017-11-01 NOTE — Telephone Encounter (Signed)
See note

## 2017-11-01 NOTE — Telephone Encounter (Signed)
Called pt and scheduled OV on 11/04/17 to review MRI results and discuss recommendations.

## 2017-11-01 NOTE — Telephone Encounter (Signed)
Please contact patient with results.

## 2017-11-04 ENCOUNTER — Encounter: Payer: Self-pay | Admitting: Sports Medicine

## 2017-11-04 ENCOUNTER — Ambulatory Visit (INDEPENDENT_AMBULATORY_CARE_PROVIDER_SITE_OTHER): Payer: PRIVATE HEALTH INSURANCE | Admitting: Sports Medicine

## 2017-11-04 VITALS — BP 110/72 | HR 67

## 2017-11-04 DIAGNOSIS — J069 Acute upper respiratory infection, unspecified: Secondary | ICD-10-CM

## 2017-11-04 DIAGNOSIS — M75101 Unspecified rotator cuff tear or rupture of right shoulder, not specified as traumatic: Secondary | ICD-10-CM | POA: Diagnosis not present

## 2017-11-04 DIAGNOSIS — G8929 Other chronic pain: Secondary | ICD-10-CM | POA: Diagnosis not present

## 2017-11-04 DIAGNOSIS — R229 Localized swelling, mass and lump, unspecified: Secondary | ICD-10-CM

## 2017-11-04 DIAGNOSIS — M25511 Pain in right shoulder: Secondary | ICD-10-CM

## 2017-11-04 DIAGNOSIS — Z720 Tobacco use: Secondary | ICD-10-CM | POA: Diagnosis not present

## 2017-11-04 MED ORDER — NITROGLYCERIN 0.2 MG/HR TD PT24: MEDICATED_PATCH | TRANSDERMAL | 1 refills | Status: DC

## 2017-11-04 NOTE — Progress Notes (Signed)
Samuel Barker. [place], [place] at [place] - 27 y.o. male MRN [id number]  Date of birth: December 24, 1990  Visit Date: 11/04/2017  PCP: Samuel Coke, PA   Referred by: Samuel Barker, [place]   Scribe for today's visit: Samuel Barker, CMA     SUBJECTIVE:  Samuel Barker is here for Follow-up (RT shoulder pain, MRI review)    Compared to the last office visit on 08/14/17, his previously described R shoulder symptoms show no change. Sometimes the shoulder hurts and other times it does not.  Current symptoms are mild & are radiating to his R upper arm only when he has "strong" pain in his R shoulder. He had been using nitroglycerin patches but stopped about 2 weeks ago. He stopped doing his home exercises after MRI was ordered.   Pt had MR arthrogram R shoulder 10/31/2017 and would like to review results/recommendations today.    ROS Denies night time disturbances. Denies fevers, chills, or night sweats. Denies unexplained weight loss. Denies personal history of cancer. Denies changes in bowel or bladder habits. Denies recent unreported falls. Denies new or worsening dyspnea or wheezing. Reports headaches or dizziness.  Denies numbness, tingling or weakness  In the extremities.  Denies dizziness or presyncopal episodes Denies lower extremity edema     HISTORY & PERTINENT PRIOR DATA:  Prior History reviewed and updated per electronic medical record.  Significant history, findings, studies and interim changes include:  reports that he has been smoking cigarettes.  He has been smoking about 1.00 pack per day. he has never used smokeless tobacco. No results for input(s): HGBA1C, LABURIC, CREATINE in the last 8760 hours. No specialty comments available. Problem  Upper Respiratory Tract Infection  Chronic Right Shoulder Pain    OBJECTIVE:  VS:  HT:    WT:   BMI:     BP:110/72  HR:67bpm  TEMP: ( )  RESP:97  %   PHYSICAL EXAM: Constitutional: WDWN, Non-toxic appearing. Psychiatric: Alert & appropriately interactive.  Not depressed or anxious appearing. Respiratory: No increased work of breathing.  Trachea Midline Eyes: Pupils are equal.  EOM intact without nystagmus.  No scleral icterus  NEUROVASCULAR exam: No clubbing or cyanosis appreciated No significant venous stasis changes Capillary Refill: normal, less than 2 seconds   Right shoulder has overall good range of motion.  He has terminal pain with abduction and forward flexion.  His strength with empty can testing is slightly diminished but this is minimal.  Internal rotation and external rotation range of motion is symmetric.  He has a small amount of pain with Hawkins and Neer's.  Bilateral ears are normal-appearing with pearly gray tympanic membranes without significant erythema.  No exudate.  Nasal turbinates are red with small amount of discharge but minimal.  Posterior oropharynx is slightly erythematous but no significant discharge and no tonsillar erythema or enlargement.  No additional findings.   ASSESSMENT & PLAN:   1. Rotator cuff syndrome of right shoulder   2. Chronic right shoulder pain   3. Skin nodule   4. Tobacco use   5. Upper respiratory tract infection, unspecified type    PLAN:    Upper respiratory tract infection Patient does have symptoms consistent with a viral URI that he was concerned about.  Reassurance was provided and symptomatic treatment discussed.  If any worsening symptoms or red flag symptoms that were reviewed he will plan to follow-up with his PCP.  Chronic right shoulder  pain MR arthrogram was overall reassuring that there is no significant intra-articular or extra-articular lesion that is causing his pain and discomfort.  He does have a small change within the supraspinatus that is likely explaining his symptoms but is nonsurgical in nature.  We discussed that smoking cessation as well as  continued therapeutic exercises and nitroglycerin protocol are the treatment of choice for this condition and he will resume these.  We once again reviewed his home exercises as well.  Additionally he was concerned about a small skin nodule that is a small keloid on the posterior aspect of his shoulder and reassurance was provided once again that this is not malignant in nature.  He does seem to be highly anxious about his shoulder in general but is likely related to the heavy lifting that he is doing in the gym specifically with above shoulder lifting which I have once again recommended he try to avoid doing at this time as well as into the future.  If any lack of improvement can consider referral for surgical intervention but at this time continue conservative care is appropriate.   ++++++++++++++++++++++++++++++++++++++++++++ Orders & Meds: No orders of the defined types were placed in this encounter.   Meds ordered this encounter  Medications  . nitroGLYCERIN (NITRODUR - DOSED IN MG/24 HR) 0.2 mg/hr patch    Sig: Place 1/2 of a patch over affected region. Remove and replace once daily.  Slightly alter skin placement daily    Dispense:  30 patch    Refill:  1    For musculoskeletal purposes.  Okay to cut patch.    ++++++++++++++++++++++++++++++++++++++++++++ Follow-up: Return in about 8 weeks (around 12/30/2017).   Pertinent documentation may be included in additional procedure notes, imaging studies, problem based documentation and patient instructions. Please see these sections of the encounter for additional information regarding this visit. CMA/ATC served as Education administrator during this visit. History, Physical, and Plan performed by medical provider. Documentation and orders reviewed and attested to.      Samuel Barker, [facility] Physician

## 2017-11-04 NOTE — Patient Instructions (Signed)
Do not do any exercises above your head.  You need to perform the exercises that we gave you on a daily basis using the yellow Thera-Band we gave you today.  Follow-up in 8 weeks.     Your physician discussed the hazards of tobacco use. Tobacco use cessation is recommended and techniques and options to help you quit were discussed.  Please think about quitting smoking.  This is very important for your health.  There are many things we can do to help you quit.  Let  us know if you are interested.  You can also call 1-800-QUIT-NOW [phone]) for free smoking cessation counseling.

## 2017-11-04 NOTE — Progress Notes (Signed)
Coming in today to review results.

## 2017-11-19 ENCOUNTER — Encounter: Payer: Self-pay | Admitting: Sports Medicine

## 2017-11-19 NOTE — Assessment & Plan Note (Signed)
MR arthrogram was overall reassuring that there is no significant intra-articular or extra-articular lesion that is causing his pain and discomfort.  He does have a small change within the supraspinatus that is likely explaining his symptoms but is nonsurgical in nature.  We discussed that smoking cessation as well as continued therapeutic exercises and nitroglycerin protocol are the treatment of choice for this condition and he will resume these.  We once again reviewed his home exercises as well.  Additionally he was concerned about a small skin nodule that is a small keloid on the posterior aspect of his shoulder and reassurance was provided once again that this is not malignant in nature.  He does seem to be highly anxious about his shoulder in general but is likely related to the heavy lifting that he is doing in the gym specifically with above shoulder lifting which I have once again recommended he try to avoid doing at this time as well as into the future.  If any lack of improvement can consider referral for surgical intervention but at this time continue conservative care is appropriate.

## 2017-11-19 NOTE — Assessment & Plan Note (Signed)
Patient does have symptoms consistent with a viral URI that he was concerned about.  Reassurance was provided and symptomatic treatment discussed.  If any worsening symptoms or red flag symptoms that were reviewed he will plan to follow-up with his PCP.

## 2017-11-20 ENCOUNTER — Ambulatory Visit (INDEPENDENT_AMBULATORY_CARE_PROVIDER_SITE_OTHER): Payer: PRIVATE HEALTH INSURANCE | Admitting: Family Medicine

## 2017-11-20 VITALS — BP 116/64 | HR 77 | Temp 98.3°F | Ht 64.0 in | Wt 174.4 lb

## 2017-11-20 DIAGNOSIS — B078 Other viral warts: Secondary | ICD-10-CM | POA: Diagnosis not present

## 2017-11-20 DIAGNOSIS — F17200 Nicotine dependence, unspecified, uncomplicated: Secondary | ICD-10-CM | POA: Diagnosis not present

## 2017-11-20 DIAGNOSIS — J40 Bronchitis, not specified as acute or chronic: Secondary | ICD-10-CM

## 2017-11-20 DIAGNOSIS — J0100 Acute maxillary sinusitis, unspecified: Secondary | ICD-10-CM | POA: Diagnosis not present

## 2017-11-20 MED ORDER — AZITHROMYCIN 250 MG PO TABS
ORAL_TABLET | ORAL | 0 refills | Status: DC
Start: 2017-11-20 — End: 2017-12-30

## 2017-11-20 MED ORDER — PREDNISONE 5 MG PO TABS
5.0000 mg | ORAL_TABLET | Freq: Every day | ORAL | 0 refills | Status: DC
Start: 2017-11-20 — End: 2017-12-30

## 2017-11-20 MED ORDER — GUAIFENESIN-CODEINE 100-10 MG/5ML PO SYRP: 10.0000 mL | ORAL_SOLUTION | Freq: Every evening | ORAL | 0 refills | Status: DC | PRN

## 2017-11-20 NOTE — Progress Notes (Signed)
Samuel Barker is a 27 y.o. male is here for an acute visit.  History of Present Illness:   HPI: Patient comes in today for a cough. He stated that he has a chronic cough due to being a smoker. The cough has gotten worse this week. Last week he had bilateral ear pain. Denies other symptoms.   Health Maintenance Due  Topic Date Due  . HIV Screening  05/15/2007  . INFLUENZA VACCINE  05/15/2017   Depression screen [name] 2/9 11/13/2016 08/13/2016 07/26/2016  Decreased Interest 0 0 0  Down, Depressed, Hopeless 0 0 0  PHQ - 2 Score 0 0 0   PMHx, SurgHx, SocialHx, FamHx, Medications, and Allergies were reviewed in the Visit Navigator and updated as appropriate.   Patient Active Problem List   Diagnosis Date Noted  . Upper respiratory tract infection 11/04/2017  . Chronic right shoulder pain 08/14/2017   Social History   Tobacco Use  . Smoking status: Current Every Day Smoker    Packs/day: 1.00    Types: Cigarettes  . Smokeless tobacco: Never Used  Substance Use Topics  . Alcohol use: No  . Drug use: No   Current Medications and Allergies:   Current Outpatient Medications:  .  nitroGLYCERIN (NITRODUR - DOSED IN MG/24 HR) 0.2 mg/hr patch, Place 1/2 of a patch over affected region. Remove and replace once daily.  Slightly alter skin placement daily, Disp: 30 patch, Rfl: 1  No Known Allergies   Review of Systems   Pertinent items are noted in the HPI. Otherwise, ROS is negative.  Vitals:   Vitals:   11/20/17 1352  BP: 116/64  Pulse: 77  Temp: 98.3 F (36.8 C)  TempSrc: Oral  SpO2: 96%  Weight: 174 lb 6.4 oz (79.1 kg)  Height: 5\' 4"  (1.626 m)     Body mass index is 29.94 kg/m.   Physical Exam:   Physical Exam  Constitutional: He is oriented to person, place, and time. He appears well-developed and well-nourished. No distress.  HENT:  Head: Normocephalic and atraumatic.  Right Ear: External ear normal.  Left Ear: External ear normal.  Nose: Nose normal.    Mouth/Throat: Oropharynx is clear and moist.  Eyes: Conjunctivae and EOM are normal. Pupils are equal, round, and reactive to light.  Neck: Normal range of motion. Neck supple.  Cardiovascular: Normal rate, regular rhythm, normal heart sounds and intact distal pulses.  Pulmonary/Chest: Effort normal. He has wheezes. He has rhonchi.  Abdominal: Soft. Bowel sounds are normal.  Musculoskeletal: Normal range of motion.  Neurological: He is alert and oriented to person, place, and time.  Skin: Skin is warm and dry.  Psychiatric: He has a normal mood and affect. His behavior is normal. Judgment and thought content normal.  Nursing note and vitals reviewed.   Assessment and Plan:   Samuel Barker was seen today for cough.  Diagnoses and all orders for this visit:  Subacute maxillary sinusitis Bronchitis Comments: > 1 week, worsening. Smoker.  Orders: -     guaiFENesin-codeine (CHERATUSSIN AC) 100-10 MG/5ML syrup; Take 10 mLs by mouth at bedtime as needed for cough or congestion. -     predniSONE (DELTASONE) 5 MG tablet; Take 1 tablet (5 mg total) by mouth daily with breakfast. Take 6-5-4-3-2-1 done -     azithromycin (ZITHROMAX) 250 MG tablet; Two tab first day and one each day until done  Nicotine use disorder Comments: I advised patient to quit smoking, and offered support. [place] QUITLINE: 1-800-QUIT-NOW [phone]).  Common wart Comments: Cryotherapy  Reason: wart Location: left middle finger  Liquid nitrogen was applied using the liquid nitrogen gun without difficulty with an otoscope tip for concentration. Tolerated well without complications.  . Reviewed expectations re: course of current medical issues. . Discussed self-management of symptoms. . Outlined signs and symptoms indicating need for more acute intervention. . Patient verbalized understanding and all questions were answered. Marland Kitchen Health Maintenance issues including appropriate healthy diet, exercise, and smoking avoidance  were discussed with patient. . See orders for this visit as documented in the electronic medical record. . Patient received an After Visit Summary.  Briscoe Deutscher, DO [place], [place] 11/22/2017  Future Appointments  Date Time Provider [place]  12/30/2017  1:40 PM Gerda Diss, DO [facility]

## 2017-11-21 ENCOUNTER — Ambulatory Visit: Payer: Self-pay | Admitting: *Deleted

## 2017-11-21 ENCOUNTER — Encounter: Payer: Self-pay | Admitting: *Deleted

## 2017-11-21 NOTE — Telephone Encounter (Signed)
Pt  Was   Seen  Yesterday  In  Office   He  States   Today  He  Feels   Worse  He  Is  Weak  Has  Chills  But  Has  Not taken  His  Temp . He  Has  A  Productive  Cough    He  Reports his  Breathing is  Better . Pt  Advised   Take  Tylenol       Every  4  Hours drink lots  Of  Water  Rest   And  followup  With  Urgent care  Or  Er  If  Worse .   Call  Back  Number  [phone]       Answer Assessment - Initial Assessment Questions 1. ONSET: "When did the cough begin?"       2   Weeks     2. SEVERITY: "How bad is the cough today?"        Bad  Today   And  Yesterday    3. RESPIRATORY DISTRESS: "Describe your breathing."         Better  Than  yesterday    4. FEVER: "Do you have a fever?" If so, ask: "What is your temperature, how was it measured, and when did it start?"         Feels    Like  Has    Fever    Has  Not checked  It    5. SPUTUM: "Describe the color of your sputum" (clear, white, yellow, green)          Yellow      6. HEMOPTYSIS: "Are you coughing up any blood?" If so ask: "How much?" (flecks, streaks, tablespoons, etc.)          Had  Slight    Blood  tinged   Sputum  About  1  Month  Ago   7. CARDIAC HISTORY: "Do you have any history of heart disease?" (e.g., heart attack, congestive heart failure)         no 8. LUNG HISTORY: "Do you have any history of lung disease?"  (e.g., pulmonary embolus, asthma, emphysema)         No  Pt is  A  Smoker   9. PE RISK FACTORS: "Do you have a history of blood clots?" (or: recent major surgery, recent prolonged travel, bedridden )           no 10. OTHER SYMPTOMS: "Do you have any other symptoms?" (e.g., runny nose, wheezing, chest pain)       Body  Aches    Weak   Chills   11. PREGNANCY: "Is there any chance you are pregnant?" "When was your last menstrual period?"       n/a 12. TRAVEL: "Have you traveled out of the country in the last month?" (e.g., travel history, exposures)           no  Protocols used: [place]

## 2017-11-21 NOTE — Telephone Encounter (Signed)
This encounter was created in error - please disregard.

## 2017-11-21 NOTE — Telephone Encounter (Signed)
Please be advised.  °

## 2017-11-22 NOTE — Telephone Encounter (Signed)
I agree with RN note.

## 2017-11-22 NOTE — Telephone Encounter (Signed)
Dr. Juleen China, see message and advise if any further recommendations.

## 2017-12-27 ENCOUNTER — Telehealth: Payer: Self-pay | Admitting: *Deleted

## 2017-12-27 NOTE — Telephone Encounter (Signed)
Spoke to pt asked him if he had his Flu shot, and if not do you want it?  Pt said no, and does not want Flu shot.

## 2017-12-30 ENCOUNTER — Ambulatory Visit (INDEPENDENT_AMBULATORY_CARE_PROVIDER_SITE_OTHER): Payer: PRIVATE HEALTH INSURANCE | Admitting: Sports Medicine

## 2017-12-30 ENCOUNTER — Encounter: Payer: Self-pay | Admitting: Sports Medicine

## 2017-12-30 VITALS — BP 104/74 | HR 69 | Ht 64.0 in | Wt 177.2 lb

## 2017-12-30 DIAGNOSIS — M9901 Segmental and somatic dysfunction of cervical region: Secondary | ICD-10-CM | POA: Diagnosis not present

## 2017-12-30 DIAGNOSIS — G8929 Other chronic pain: Secondary | ICD-10-CM

## 2017-12-30 DIAGNOSIS — M25511 Pain in right shoulder: Secondary | ICD-10-CM | POA: Diagnosis not present

## 2017-12-30 DIAGNOSIS — M9908 Segmental and somatic dysfunction of rib cage: Secondary | ICD-10-CM | POA: Diagnosis not present

## 2017-12-30 DIAGNOSIS — M9902 Segmental and somatic dysfunction of thoracic region: Secondary | ICD-10-CM

## 2017-12-30 NOTE — Progress Notes (Signed)
PROCEDURE NOTE : OSTEOPATHIC MANIPULATION The decision today to treat with Osteopathic Manipulative Therapy (OMT) was based on physical exam findings. Verbal consent was obtained following a discussion with the patient regarding the of risks, benefits and potential side effects, including an acute pain flare,post manipulation soreness and need for repeat treatments.    Contraindications to OMT reviewed and include: NONE  Manipulation was performed as below: Regions Treated: cervial spine, thoracic spine and Ribs Techniques used: HVLA, muscle energy, myofascial release and Articulatory   The patient tolerated the treatment well and reported Improved symptoms following treatment today. Patient was given medications, exercises, stretches and lifestyle modifications per AVS and verbally.     OSTEOPATHIC/STRUCTURAL EXAM FINDINGS:   C2 FRS right C4 through C6 rotated left, side bent right C7 extended rotated right T2 through T6 neutral rotated right, side bent left Posterior rib 6 on the left

## 2017-12-30 NOTE — Progress Notes (Signed)
  Samuel Barker - 27 y.o. male MRN [id number]  Date of birth: Aug 23, 1991  Scribe for today's visit: Josepha Pigg, CMA     SUBJECTIVE:  Samuel Barker is here for Follow-up (R shoulder pain)   11/04/17: Compared to the last office visit on 08/14/17, his previously described R shoulder symptoms show no change. Sometimes the shoulder hurts and other times it does not.  Current symptoms are mild & are radiating to his R upper arm only when he has "strong" pain in his R shoulder. He had been using nitroglycerin patches but stopped about 2 weeks ago. He stopped doing his home exercises after MRI was ordered.  Pt had MR arthrogram R shoulder 10/31/2017 and would like to review results/recommendations today.   12/30/17: Compared to the last office visit, his previously described symptoms are improving, he goes 3-4 days without pain or pain will only last for a short time. He has noticed stabbing pain in the mid back, near scapula, and pectoral muscles.  Current symptoms are moderate-severe during flare-up & are nonradiating He has been doing home exercises inconsistently over the past 3 weeks or so. He has been using Nitroglycerin patches with some relief. He does have occasional HA but isn't sure if its related to Nitroglycerin patches or his braces.    ROS Reports night time disturbances, hard to fall asleep. Denies fevers, chills, or night sweats. Denies unexplained weight loss. Denies personal history of cancer. Denies changes in bowel or bladder habits. Denies recent unreported falls. Denies new or worsening dyspnea or wheezing. Reports headaches or dizziness.  Denies numbness, tingling or weakness  In the extremities.  Denies dizziness or presyncopal episodes Denies lower extremity edema      Please see additional documentation for Objective, Assessment and Plan sections. Pertinent additional documentation may be included in corresponding procedure notes, imaging studies, problem based  documentation and patient instructions. Please see these sections of the encounter for additional information regarding this visit.  CMA/ATC served as Education administrator during this visit. History, Physical, and Plan performed by medical provider. Documentation and orders reviewed and attested to.      Gerda Diss, [facility]

## 2017-12-30 NOTE — Assessment & Plan Note (Signed)
Ongoing right shoulder pain that is significantly better.  He does report some associated dizziness that may be related to the nitroglycerin we will have him discontinue this at this time.  Osteopathic manipulation performed per procedure note for scapular dyskinesis and periscapular muscle spasms.

## 2017-12-30 NOTE — Progress Notes (Signed)
   Samuel Barker. [place], [facility] - 27 y.o. male MRN [id number]  Date of birth: Jan 29, 1991  Visit Date:   PCP: Inda Coke, PA   Referred by: Inda Coke, [place]  Please see additional documentation for HPI, review of systems.  HISTORY & PERTINENT PRIOR DATA:  Prior History reviewed and updated per electronic medical record.  Significant history, findings, studies and interim changes include:  reports that he has been smoking cigarettes.  He has been smoking about 1.00 pack per day. he has never used smokeless tobacco. No results for input(s): HGBA1C, LABURIC, CREATINE in the last 8760 hours. No specialty comments available. Problem  Chronic Right Shoulder Pain    OBJECTIVE:  VS:  HT:5\' 4"  (162.6 cm)   WT:177 lb 3.2 oz (80.4 kg)  BMI:30.4    BP:104/74  HR:69bpm  TEMP: ( )  RESP:96 %   PHYSICAL EXAM: Constitutional: WDWN, Non-toxic appearing. Psychiatric: Alert & appropriately interactive.  Not depressed or anxious appearing. Respiratory: No increased work of breathing.  Trachea Midline Eyes: Pupils are equal.  EOM intact without nystagmus.  No scleral icterus  NEUROVASCULAR exam: No clubbing or cyanosis appreciated No significant venous stasis changes Capillary Refill: normal, less than 2 seconds  Extremities warm to touch, pink, with no edema.  inspection of back is normal, paraspinal tenderness noted right periscapular region Normal cervical range of motion that is pain-free and functional range however limitations per procedure note.  Negative Spurling's and Lhermitte's compression test.  Upper extremity strength is otherwise intact.  Shoulder is significantly improved strength and is pain-free with speeds testing and empty can testing.   ASSESSMENT & PLAN:   1. Chronic right shoulder pain   2. Somatic dysfunction of cervical region   3. Somatic dysfunction of thoracic region    4. Somatic dysfunction of rib cage region    Orders & Meds: No orders of the defined types were placed in this encounter.  No orders of the defined types were placed in this encounter.   PLAN:  Osteopathic manipulation was performed today based on physical exam findings.  Please see procedure note for further information including Osteopathic Exam findings.  If persistent dizziness status post discontinuing nitroglycerin recommend follow-up with primary care physician.   Chronic right shoulder pain Ongoing right shoulder pain that is significantly better.  He does report some associated dizziness that may be related to the nitroglycerin we will have him discontinue this at this time.  Osteopathic manipulation performed per procedure note for scapular dyskinesis and periscapular muscle spasms.   Follow-up: Return if symptoms worsen or fail to improve, for as needed for ongoing issues.

## 2018-03-05 ENCOUNTER — Encounter: Payer: Self-pay | Admitting: Physician Assistant

## 2018-03-05 ENCOUNTER — Ambulatory Visit (INDEPENDENT_AMBULATORY_CARE_PROVIDER_SITE_OTHER): Payer: PRIVATE HEALTH INSURANCE | Admitting: Physician Assistant

## 2018-03-05 VITALS — BP 112/62 | HR 82 | Temp 98.5°F | Ht 64.0 in | Wt 178.4 lb

## 2018-03-05 DIAGNOSIS — J069 Acute upper respiratory infection, unspecified: Secondary | ICD-10-CM

## 2018-03-05 DIAGNOSIS — Z720 Tobacco use: Secondary | ICD-10-CM

## 2018-03-05 MED ORDER — METHYLPREDNISOLONE ACETATE 80 MG/ML IJ SUSP
80.0000 mg | Freq: Once | INTRAMUSCULAR | Status: AC
  Administered 2018-03-05: 80 mg via INTRAMUSCULAR

## 2018-03-05 MED ORDER — AZITHROMYCIN 250 MG PO TABS: ORAL_TABLET | ORAL | 0 refills | Status: DC

## 2018-03-05 NOTE — Progress Notes (Signed)
Samuel Barker is a 27 y.o. male here for a new problem.  I acted as a Education administrator for [employer], PA-C Anselmo Pickler, LPN  History of Present Illness:   Chief Complaint  Patient presents with  . Cough    x 2 days  . Sore Throat  . Generalized Body Aches    Cough  This is a new problem. Episode onset: started 2 days ago. The problem has been gradually worsening. The cough is productive of sputum (expectorating white sputum occassionally). Associated symptoms include chills, nasal congestion (yellow drainage), postnasal drip and a sore throat. Pertinent negatives include no fever, headaches or shortness of breath. Associated symptoms comments: Body aches. The symptoms are aggravated by lying down. Risk factors for lung disease include travel (Was in [place] last week). He has tried nothing for the symptoms. There is no history of asthma, bronchitis or pneumonia.   He continues to smoke.  He smokes approximately 10 cigarettes/day.  His appetite is reduced.  Past Medical History:  Diagnosis Date  . Tobacco abuse      Social History   Socioeconomic History  . Marital status: Single    Spouse name: Not on file  . Number of children: Not on file  . Years of education: Not on file  . Highest education level: Not on file  Occupational History  . Not on file  Social Needs  . Financial resource strain: Not on file  . Food insecurity:    Worry: Not on file    Inability: Not on file  . Transportation needs:    Medical: Not on file    Non-medical: Not on file  Tobacco Use  . Smoking status: Current Every Day Smoker    Packs/day: 1.00    Types: Cigarettes  . Smokeless tobacco: Never Used  Substance and Sexual Activity  . Alcohol use: No  . Drug use: No  . Sexual activity: Never  Lifestyle  . Physical activity:    Days per week: Not on file    Minutes per session: Not on file  . Stress: Not on file  Relationships  . Social connections:    Talks on phone: Not on file    Gets  together: Not on file    Attends religious service: Not on file    Active member of club or organization: Not on file    Attends meetings of clubs or organizations: Not on file    Relationship status: Not on file  . Intimate partner violence:    Fear of current or ex partner: Not on file    Emotionally abused: Not on file    Physically abused: Not on file    Forced sexual activity: Not on file  Other Topics Concern  . Not on file  Social History Narrative   From [place]    History reviewed. No pertinent surgical history.  Family History  Problem Relation Age of Onset  . Diabetes Mother   . Diabetes Father   . Diabetes Maternal Grandmother   . Diabetes Maternal Grandfather   . Diabetes Paternal Grandmother   . Heart disease Paternal Grandmother   . Diabetes Paternal Grandfather     No Known Allergies  Current Medications:   Current Outpatient Medications:  .  azithromycin (ZITHROMAX) 250 MG tablet, Take 2 tablets on day 1, then one tablet daily x 4 days, Disp: 6 tablet, Rfl: 0   Review of Systems:   Review of Systems  Constitutional: Positive for chills. Negative  for fever.  HENT: Positive for postnasal drip and sore throat.   Respiratory: Positive for cough. Negative for shortness of breath.   Neurological: Negative for headaches.    Vitals:   Vitals:   03/05/18 1303  BP: 112/62  Pulse: 82  Temp: 98.5 F (36.9 C)  TempSrc: Oral  SpO2: 96%  Weight: 178 lb 6.1 oz (80.9 kg)  Height: 5\' 4"  (1.626 m)     Body mass index is 30.62 kg/m.  Physical Exam:   Physical Exam  Constitutional: He appears well-developed. He is cooperative.  Non-toxic appearance. He does not have a sickly appearance. He does not appear ill. No distress.  Dry cough throughout exam.  HENT:  Head: Normocephalic and atraumatic.  Right Ear: Tympanic membrane, external ear and ear canal normal. Tympanic membrane is not erythematous, not retracted and not bulging.  Left Ear: Tympanic  membrane, external ear and ear canal normal. Tympanic membrane is not erythematous, not retracted and not bulging.  Nose: Nose normal. Right sinus exhibits no maxillary sinus tenderness and no frontal sinus tenderness. Left sinus exhibits no maxillary sinus tenderness and no frontal sinus tenderness.  Mouth/Throat: Uvula is midline and mucous membranes are normal. No posterior oropharyngeal edema or posterior oropharyngeal erythema. Tonsils are 1+ on the right. Tonsils are 1+ on the left. No tonsillar exudate.  Eyes: Conjunctivae and lids are normal.  Neck: Trachea normal.  Cardiovascular: Normal rate, regular rhythm, S1 normal, S2 normal and normal heart sounds.  Pulmonary/Chest: Effort normal and breath sounds normal. He has no decreased breath sounds. He has no wheezes. He has no rhonchi. He has no rales.  Lymphadenopathy:    He has cervical adenopathy.  Neurological: He is alert.  Skin: Skin is warm, dry and intact.  Psychiatric: He has a normal mood and affect. His speech is normal and behavior is normal.  Nursing note and vitals reviewed.   Assessment and Plan:    Jazir was seen today for cough, sore throat and generalized body aches.  Diagnoses and all orders for this visit:  Upper respiratory tract infection, unspecified type and Tobacco use No red flags on exam.  He received a prednisone injection today and tolerated well.  I recommended that he start Delsym.  We also sent in azithromycin for him to start if not improved by the end of the weekend.  Discussed smoking cessation, patient is not ready to quit at this time.  Discussed taking medications as prescribed. Reviewed return precautions including worsening fever, SOB, worsening cough or other concerns. Push fluids and rest. I recommend that patient follow-up if symptoms worsen or persist despite treatment x 7-10 days, sooner if needed.  Other orders -     azithromycin (ZITHROMAX) 250 MG tablet; Take 2 tablets on day 1, then one  tablet daily x 4 days    . Reviewed expectations re: course of current medical issues. . Discussed self-management of symptoms. . Outlined signs and symptoms indicating need for more acute intervention. . Patient verbalized understanding and all questions were answered. . See orders for this visit as documented in the electronic medical record. . Patient received an After-Visit Summary.  CMA or LPN served as scribe during this visit. History, Physical, and Plan performed by medical provider. Documentation and orders reviewed and attested to.  Inda Coke, PA-C

## 2018-03-05 NOTE — Patient Instructions (Signed)
It was great to see you!  You have a viral upper respiratory infection. Antibiotics are not needed for this.  Viral infections usually take 7-10 days to resolve.  The cough can last a few weeks to go away.  Use medication as prescribed: Delsym  Push fluids and get plenty of rest. Please return if you are not improving as expected, or if you have high fevers (>101.5) or difficulty swallowing or worsening productive cough.  *If your symptoms are not improved by the weekend, you may start the antibiotic that I have sent in for you (azithromycin).  STOP SMOKING!!!  Call clinic with questions.  I hope you start feeling better soon!

## 2018-07-30 IMAGING — XA DG ARTHROGRAM SHOULDER*R*
2 series · 2 of 2 positions shown · non-contrast
Comparison: none

CLINICAL DATA: Chronic right shoulder pain.

EXAM:
RIGHT SHOULDER INJECTION UNDER FLUOROSCOPY
TECHNIQUE: The risks and benefits of the procedure were discussed with the
patient, and written informed consent was obtained. The patient
stated no history of allergy to contrast media. A formal timeout
procedure was performed with the patient according to departmental
protocol.

[Series 1: ortho standard · 1 of 1 slices shown (1 of 2)]
[im 1/1]
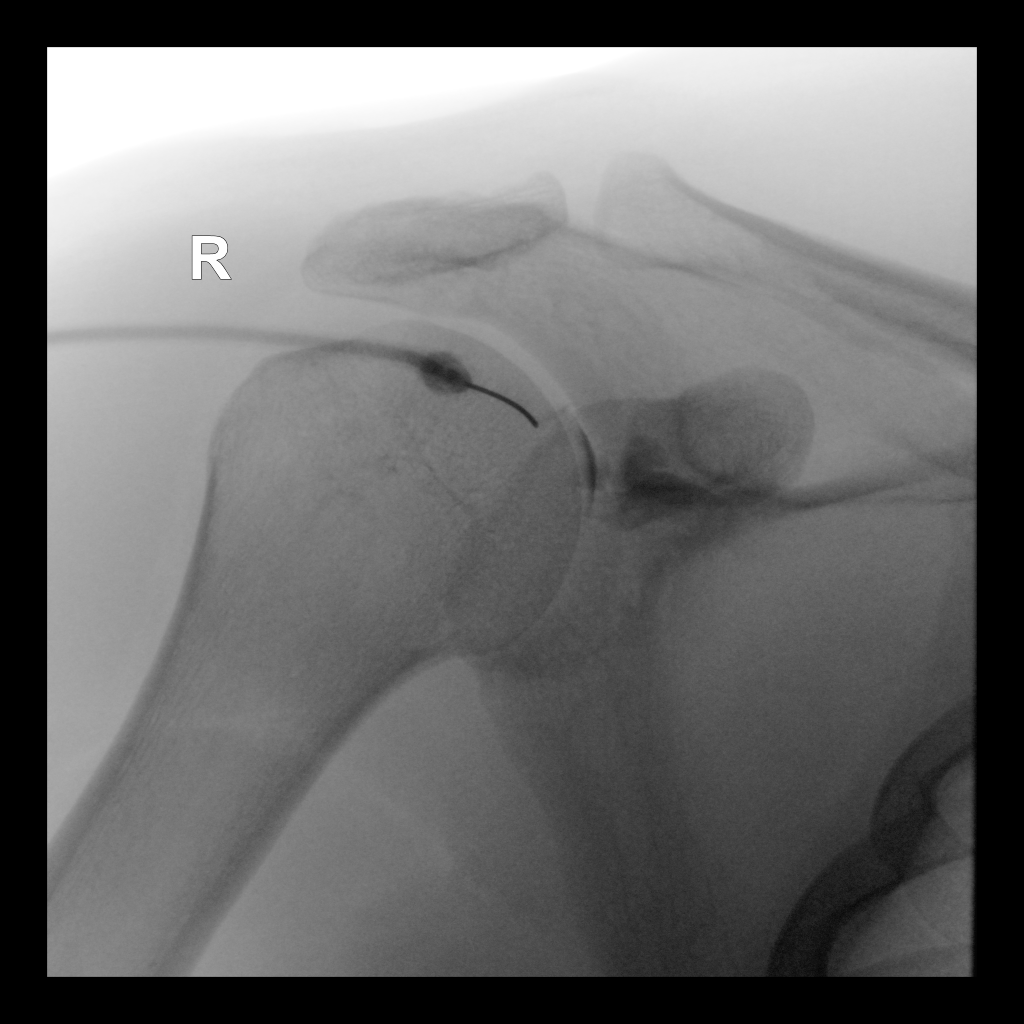

[Series 2: ortho standard · 1 of 1 slices shown (2 of 2)]
[im 1/1]
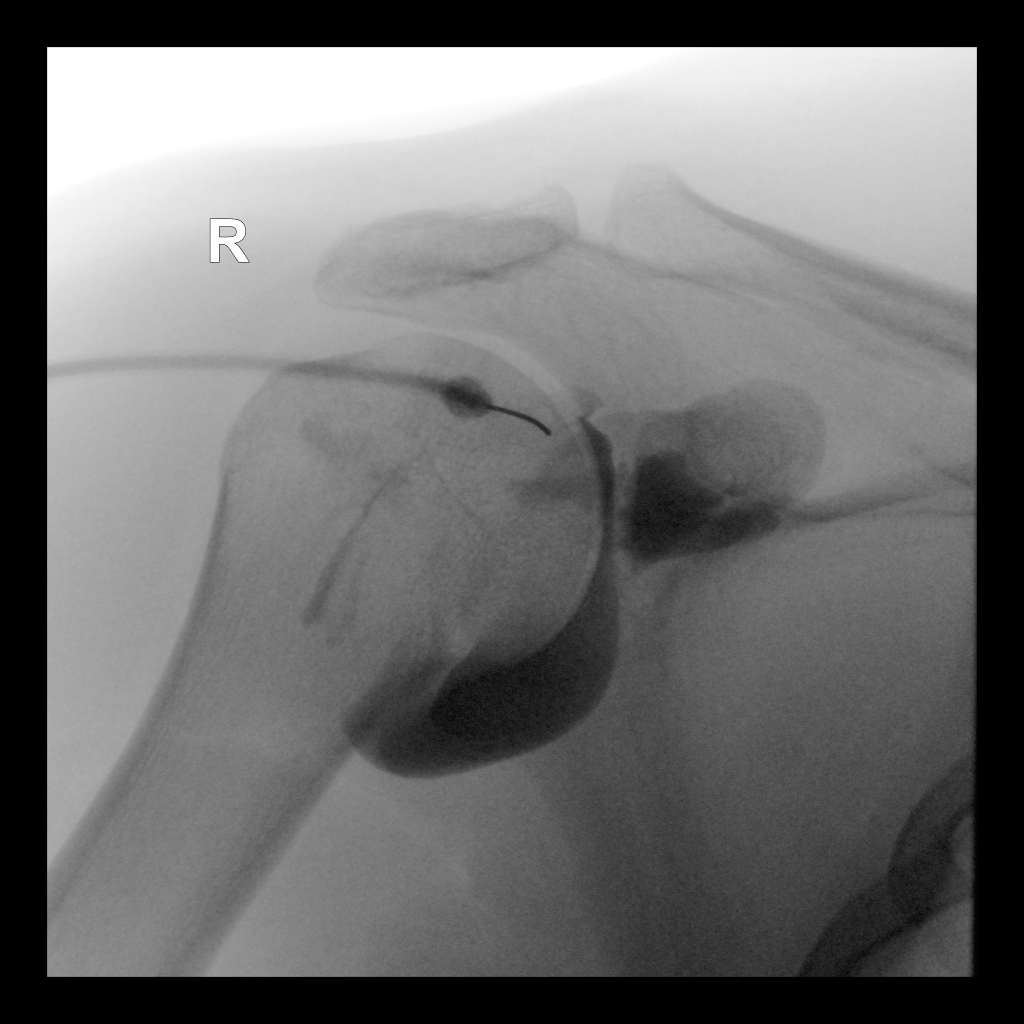

[2 of 2 positions shown; findings below may reference images not displayed]

The patient was placed supine on the fluoroscopy table and the right
glenohumeral joint was identified under fluoroscopy. The skin
overlying the right glenohumeral joint was subsequently cleaned with
Betadine and a sterile drape was placed over the area of interest. 2
ml 1% Lidocaine was used to anesthetize the skin around the needle
insertion site.

A 22 gauge spinal needle was inserted into the right glenohumeral
joint under fluoroscopy.

12 ml of gadolinium mixture (0.1 ml of Multihance mixed with 5 ml of
1% lidocaine and 15 ml of Isovue-M 200 contrast) was injected into
the right glenohumeral joint.

The needle was removed and hemostasis was achieved. The patient was
subsequently transferred to MRI for imaging.

FLUOROSCOPY TIME:  Fluoroscopy Time:  1 second

Radiation Exposure Index (if provided by the fluoroscopic device):
5.89 ?Gy*m2

Number of Acquired Spot Images: 0
FINDINGS: Contrast injection confirms intra-articular placement of the needle.
IMPRESSION: Technically successful right shoulder injection for MRI.

## 2018-09-30 ENCOUNTER — Ambulatory Visit (INDEPENDENT_AMBULATORY_CARE_PROVIDER_SITE_OTHER): Payer: PRIVATE HEALTH INSURANCE | Admitting: Physician Assistant

## 2018-09-30 ENCOUNTER — Encounter: Payer: Self-pay | Admitting: Physician Assistant

## 2018-09-30 VITALS — BP 118/86 | HR 69 | Temp 98.9°F | Ht 64.0 in | Wt 181.5 lb

## 2018-09-30 DIAGNOSIS — Z114 Encounter for screening for human immunodeficiency virus [HIV]: Secondary | ICD-10-CM

## 2018-09-30 DIAGNOSIS — B349 Viral infection, unspecified: Secondary | ICD-10-CM

## 2018-09-30 DIAGNOSIS — R5383 Other fatigue: Secondary | ICD-10-CM | POA: Diagnosis not present

## 2018-09-30 MED ORDER — PREDNISONE 20 MG PO TABS: 40.0000 mg | ORAL_TABLET | Freq: Every day | ORAL | 0 refills | Status: DC

## 2018-09-30 NOTE — Progress Notes (Signed)
Samuel Barker is a 27 y.o. male here for a new problem.   I acted as a Education administrator for [employer], PA-C Samuel Pickler, LPN  History of Present Illness:   Chief Complaint  Patient presents with  . Otalgia    Otalgia   There is pain in the left ear. This is a new problem. The current episode started yesterday. The problem occurs constantly. The problem has been gradually improving. There has been no fever. The pain is at a severity of 7/10. The pain is moderate. Associated symptoms include headaches (Left side) and a sore throat. Pertinent negatives include no coughing, neck pain or vomiting. He has tried nothing for the symptoms.   Denies vision changes.  He also endorses fatigue x 1 year. Denies: unusual joint pain, chest pain, change in weight, fevers, chills. Drinks 1 cup of coffee daily. States that sleep is adequate.  Past Medical History:  Diagnosis Date  . Tobacco abuse      Social History   Socioeconomic History  . Marital status: Single    Spouse name: Not on file  . Number of children: Not on file  . Years of education: Not on file  . Highest education level: Not on file  Occupational History  . Not on file  Social Needs  . Financial resource strain: Not on file  . Food insecurity:    Worry: Not on file    Inability: Not on file  . Transportation needs:    Medical: Not on file    Non-medical: Not on file  Tobacco Use  . Smoking status: Current Every Day Smoker    Packs/day: 1.00    Types: Cigarettes  . Smokeless tobacco: Never Used  Substance and Sexual Activity  . Alcohol use: No  . Drug use: No  . Sexual activity: Never  Lifestyle  . Physical activity:    Days per week: Not on file    Minutes per session: Not on file  . Stress: Not on file  Relationships  . Social connections:    Talks on phone: Not on file    Gets together: Not on file    Attends religious service: Not on file    Active member of club or organization: Not on file    Attends  meetings of clubs or organizations: Not on file    Relationship status: Not on file  . Intimate partner violence:    Fear of current or ex partner: Not on file    Emotionally abused: Not on file    Physically abused: Not on file    Forced sexual activity: Not on file  Other Topics Concern  . Not on file  Social History Narrative   From [place]    History reviewed. No pertinent surgical history.  Family History  Problem Relation Age of Onset  . Diabetes Mother   . Diabetes Father   . Diabetes Maternal Grandmother   . Diabetes Maternal Grandfather   . Diabetes Paternal Grandmother   . Heart disease Paternal Grandmother   . Diabetes Paternal Grandfather     No Known Allergies  Current Medications:   Current Outpatient Medications:  .  predniSONE (DELTASONE) 20 MG tablet, Take 2 tablets (40 mg total) by mouth daily., Disp: 10 tablet, Rfl: 0   Review of Systems:   Review of Systems  HENT: Positive for ear pain and sore throat.   Respiratory: Negative for cough.   Gastrointestinal: Negative for vomiting.  Musculoskeletal: Negative for neck  pain.  Neurological: Positive for headaches (Left side).    Vitals:   Vitals:   09/30/18 1458 09/30/18 1537  BP: 124/80 118/86  Pulse: (!) 102 69  Temp: 98.9 F (37.2 C)   TempSrc: Oral   SpO2: 97% 95%  Weight: 181 lb 8 oz (82.3 kg)   Height: 5\' 4"  (1.626 m)      Body mass index is 31.15 kg/m.  Physical Exam:   Physical Exam Vitals signs and nursing note reviewed.  Constitutional:      General: He is not in acute distress.    Appearance: He is well-developed. He is not ill-appearing or toxic-appearing.  HENT:     Head: Normocephalic and atraumatic.     Right Ear: Tympanic membrane, ear canal and external ear normal. Tympanic membrane is not erythematous, retracted or bulging.     Left Ear: Tympanic membrane, ear canal and external ear normal. Tympanic membrane is not erythematous, retracted or bulging.     Nose:      Right Sinus: No maxillary sinus tenderness or frontal sinus tenderness.     Left Sinus: Maxillary sinus tenderness and frontal sinus tenderness present.     Mouth/Throat:     Pharynx: Uvula midline. Posterior oropharyngeal erythema present.     Tonsils: No tonsillar exudate. Swelling: 1+ on the right. 1+ on the left.  Eyes:     General: Lids are normal.     Extraocular Movements:     Right eye: Normal extraocular motion.     Left eye: Normal extraocular motion.     Conjunctiva/sclera: Conjunctivae normal.     Right eye: No hemorrhage.    Left eye: No hemorrhage. Neck:     Musculoskeletal: Full passive range of motion without pain, normal range of motion and neck supple.     Trachea: Trachea normal.  Cardiovascular:     Rate and Rhythm: Normal rate and regular rhythm.     Heart sounds: Normal heart sounds, S1 normal and S2 normal.  Pulmonary:     Effort: Pulmonary effort is normal.     Breath sounds: Normal breath sounds. No decreased breath sounds, wheezing, rhonchi or rales.  Lymphadenopathy:     Cervical: No cervical adenopathy.  Skin:    General: Skin is warm and dry.  Neurological:     Mental Status: He is alert.     GCS: GCS eye subscore is 4. GCS verbal subscore is 5. GCS motor subscore is 6.     Cranial Nerves: Cranial nerves are intact.     Sensory: Sensation is intact.     Motor: Motor function is intact.  Psychiatric:        Speech: Speech normal.        Behavior: Behavior normal. Behavior is cooperative.     Assessment and Plan:   Samuel Barker was seen today for otalgia.  Diagnoses and all orders for this visit:  Fatigue, unspecified type Patient requesting labs for this today. Will check. Discussed pushing fluids, exercise, and healthy eating. Will contact patient with results. -     CBC with Differential/Platelet -     Comprehensive metabolic panel -     TSH  Screening for HIV (human immunodeficiency virus) -     HIV Antibody (routine testing w  rflx)  Viral illness No red flags on exam.  Suspect possible viral illness - pharyngitis and sinusitis. May trial oral prednisone per orders. Discussed taking medications as prescribed. Reviewed return precautions including worsening fever, SOB, worsening cough  or other concerns. Push fluids and rest. I recommend that patient follow-up if symptoms worsen or persist despite treatment x 7-10 days, sooner if needed.  *of note, patient had a vasovagal episode in the lab after completion of lab draw. Became diaphoretic, pale. Patient did not fall. Was put in wheelchair and brought to patient room. Patient's vitals were stable -- BP 118/86 and pulse is 69. Patient drank water. Patient was coherent throughout entire episode. After about 5 minutes, patient verbalized that he was ready to leave. He was walked out of office and did not display any residual symptoms or concerns.  Other orders -     predniSONE (DELTASONE) 20 MG tablet; Take 2 tablets (40 mg total) by mouth daily.  . Reviewed expectations re: course of current medical issues. . Discussed self-management of symptoms. . Outlined signs and symptoms indicating need for more acute intervention. . Patient verbalized understanding and all questions were answered. . See orders for this visit as documented in the electronic medical record. . Patient received an After-Visit Summary.  CMA or LPN served as scribe during this visit. History, Physical, and Plan performed by medical provider. The above documentation has been reviewed and is accurate and complete.  Inda Coke, PA-C

## 2018-09-30 NOTE — Patient Instructions (Signed)
It was great to see you!  You have a viral upper respiratory infection. Antibiotics are not needed for this.  Viral infections usually take 7-10 days to resolve.  The cough can last a few weeks to go away.  You may take oral prednisone for your throat and ear pain.  We will contact you with your lab results.  Push fluids and get plenty of rest. Please return if you are not improving as expected, or if you have high fevers (>101.5) or difficulty swallowing or worsening productive cough.  Call clinic with questions.  I hope you start feeling better soon!

## 2018-10-01 LAB — COMPREHENSIVE METABOLIC PANEL
ALT: 42 U/L (ref 0–53)
AST: 21 U/L (ref 0–37)
Albumin: 5 g/dL (ref 3.5–5.2)
Alkaline Phosphatase: 82 U/L (ref 39–117)
BUN: 11 mg/dL (ref 6–23)
CALCIUM: 10.1 mg/dL (ref 8.4–10.5)
CHLORIDE: 101 meq/L (ref 96–112)
CO2: 30 meq/L (ref 19–32)
CREATININE: 1.06 mg/dL (ref 0.40–1.50)
GFR: 89.49 mL/min (ref >60.00)
Glucose, Bld: 107 mg/dL — ABNORMAL HIGH (ref 70–99)
Potassium: 4.4 mEq/L (ref 3.5–5.1)
SODIUM: 139 meq/L (ref 135–145)
Total Bilirubin: 1 mg/dL (ref 0.2–1.2)
Total Protein: 7.7 g/dL (ref 6.0–8.3)

## 2018-10-01 LAB — CBC WITH DIFFERENTIAL/PLATELET
BASOS PCT: 1.3 % (ref 0.0–3.0)
Basophils Absolute: 0.1 10*3/uL (ref 0.0–0.1)
EOS ABS: 0.1 10*3/uL (ref 0.0–0.7)
Eosinophils Relative: 1.4 % (ref 0.0–5.0)
HCT: 49.8 % (ref 39.0–52.0)
Hemoglobin: 17.3 g/dL — ABNORMAL HIGH (ref 13.0–17.0)
LYMPHS ABS: 2 10*3/uL (ref 0.7–4.0)
Lymphocytes Relative: 29 % (ref 12.0–46.0)
MCHC: 34.8 g/dL (ref 30.0–36.0)
MCV: 89.1 fl (ref 78.0–100.0)
MONO ABS: 0.4 10*3/uL (ref 0.1–1.0)
Monocytes Relative: 5.7 % (ref 3.0–12.0)
NEUTROS ABS: 4.2 10*3/uL (ref 1.4–7.7)
Neutrophils Relative %: 62.6 % (ref 43.0–77.0)
PLATELETS: 198 10*3/uL (ref 150.0–400.0)
RBC: 5.58 Mil/uL (ref 4.22–5.81)
RDW: 12.7 % (ref 11.5–15.5)
WBC: 6.8 10*3/uL (ref 4.0–10.5)

## 2018-10-01 LAB — HIV ANTIBODY (ROUTINE TESTING W REFLEX): HIV 1&2 Ab, 4th Generation: NONREACTIVE

## 2018-10-01 LAB — TSH: TSH: 1.52 u[IU]/mL (ref 0.35–4.50)

## 2019-01-15 ENCOUNTER — Encounter: Payer: Self-pay | Admitting: Physician Assistant

## 2019-01-15 ENCOUNTER — Ambulatory Visit (INDEPENDENT_AMBULATORY_CARE_PROVIDER_SITE_OTHER): Payer: PRIVATE HEALTH INSURANCE | Admitting: Physician Assistant

## 2019-01-15 DIAGNOSIS — R5383 Other fatigue: Secondary | ICD-10-CM

## 2019-01-15 NOTE — Progress Notes (Signed)
Virtual Visit via Video   I connected with Samuel Barker on 01/15/19 at  1:40 PM EDT by a video enabled telemedicine application and verified that I am speaking with the correct person using two identifiers. Location patient: Home Location provider: [employer], Office Persons participating in the virtual visit: Demetri Goshert, Inda Coke, [place], Inda Coke, PA-C   I discussed the limitations of evaluation and management by telemedicine and the availability of in person appointments. The patient expressed understanding and agreed to proceed.  Subjective:  I acted as a Education administrator for [employer], [employer], LPN  HPI:  Fatigue Pt c/o having no energy x 1 year. Last visit 09/30/18 endorsed the same issue, labs checked were normal. Pt c/o fatigue again sleeping a lot the past week. Pt says once he takes a shower he feels good and then afternoon starts to feel very tired. Pt have back pain, neck pain. Denies: fevers, chills, neck stiffness.  Pt watching TV, doing homework staying at home all the time, occasionally walks outside his apartment complex. Pt is eating chicken with vegetables, spaghetti, drinks lots of water, no alcohol. Pt's family checks on him everyday calls him on the phone. Pt sleeping 5 hours at a time and takes a nap in the afternoon. Pt denies cough, chest pain or SOB. Pt smoking 1 pack a day.  Denies polyphagia and polydipsia, but states that he read his symptoms online and he thought that he had DM. Denies significant weight changes.  ROS: See pertinent positives and negatives per HPI.  Patient Active Problem List   Diagnosis Date Noted  . Upper respiratory tract infection 11/04/2017  . Chronic right shoulder pain 08/14/2017    Social History   Tobacco Use  . Smoking status: Current Every Day Smoker    Packs/day: 1.00    Types: Cigarettes  . Smokeless tobacco: Never Used  Substance Use Topics  . Alcohol use: No   No current outpatient  medications on file.  No Known Allergies  Objective:   VITALS: Per patient if applicable, see vitals. GENERAL: Alert, appears well and in no acute distress. HEENT: Atraumatic, conjunctiva clear, no obvious abnormalities on inspection of external nose and ears. NECK: Normal movements of the head and neck. CARDIOPULMONARY: No increased WOB. Speaking in clear sentences. I:E ratio WNL.  MS: Moves all visible extremities without noticeable abnormality. PSYCH: Pleasant and cooperative, well-groomed. Speech normal rate and rhythm. Affect is appropriate. Insight and judgement are appropriate. Attention is focused, linear, and appropriate.  NEURO: CN grossly intact. Oriented as arrived to appointment on time with no prompting. Moves both UE equally.  SKIN: No obvious lesions, wounds, erythema, or cyanosis noted on face or hands.  Assessment and Plan:  Discussed fatigue with patient  Will mail patient information on what OTC supplements to take.   Ottavio was seen today for fatigue.  Diagnoses and all orders for this visit:  Fatigue, unspecified type   Multifactorial. I think he is not getting out much and dealing with some situational anxiety and depression regarding COVID. Spends excessive amount of time on phone and watching TV. He denies SI/HI.  Advised as follows: 1. Try to go outside and get fresh air at least 2-3 times a day for 10-15 minutes. 2. Try exercising, such as push-ups, doing brisk walks, or watching youtube work-out videos to guide you for home exercises. Aim for 20 minutes daily. 3. Reduce smoking. 4. Drink at least 6-8 glasses of plain water daily. 5. Call  friends and family daily. 6. Try to stop taking a nap during the day. 7. Avoid excessive caffeine.  Follow-up with [place] in 2 weeks if you are still feeling fatigued. If persists, we may need to get lab work.   . Reviewed expectations re: course of current medical issues. . Discussed self-management of symptoms. .  Outlined signs and symptoms indicating need for more acute intervention. . Patient verbalized understanding and all questions were answered. Marland Kitchen Health Maintenance issues including appropriate healthy diet, exercise, and smoking avoidance were discussed with patient. . See orders for this visit as documented in the electronic medical record.  I discussed the assessment and treatment plan with the patient. The patient was provided an opportunity to ask questions and all were answered. The patient agreed with the plan and demonstrated an understanding of the instructions.   The patient was advised to call back or seek an in-person evaluation if the symptoms worsen or if the condition fails to improve as anticipated.  CMA or LPN served as scribe during this visit. History, Physical, and Plan performed by medical provider. The above documentation has been reviewed and is accurate and complete.   [place], [place] 01/15/2019

## 2019-01-15 NOTE — Patient Instructions (Addendum)
It was great to talk to you!  1. Try to go outside and get fresh air at least 2-3 times a day for 10-15 minutes. 2. Try exercising, such as push-ups, doing brisk walks, or watching youtube work-out videos to guide you for home exercises. Aim for 20 minutes daily. 3. Reduce smoking. 4. Drink at least 6-8 glasses of plain water daily. 5. Call friends and family daily. 6. Try to stop taking a nap during the day. 7. Avoid excessive caffeine.  Follow-up with [place] in 2 weeks if you are still feeling fatigued.  We are always here for you!

## 2023-05-27 ENCOUNTER — Telehealth: Payer: Self-pay | Admitting: Physician Assistant

## 2023-05-27 NOTE — Telephone Encounter (Signed)
Called patient no answer left VM to call office back to schedule next available cpe.
